# Patient Record
Sex: Male | Born: 1970 | Race: White | Hispanic: No | Marital: Single | Smoking: Current every day smoker
Health system: Southern US, Community
[De-identification: ages and names within clinical notes are randomized; demographics above are authoritative.]

## PROBLEM LIST (undated history)

## (undated) DIAGNOSIS — E039 Hypothyroidism, unspecified: Secondary | ICD-10-CM

## (undated) DIAGNOSIS — K219 Gastro-esophageal reflux disease without esophagitis: Secondary | ICD-10-CM

## (undated) DIAGNOSIS — R011 Cardiac murmur, unspecified: Secondary | ICD-10-CM

## (undated) DIAGNOSIS — J42 Unspecified chronic bronchitis: Secondary | ICD-10-CM

## (undated) DIAGNOSIS — G40409 Other generalized epilepsy and epileptic syndromes, not intractable, without status epilepticus: Secondary | ICD-10-CM

## (undated) DIAGNOSIS — F419 Anxiety disorder, unspecified: Secondary | ICD-10-CM

## (undated) DIAGNOSIS — J189 Pneumonia, unspecified organism: Secondary | ICD-10-CM

## (undated) DIAGNOSIS — I10 Essential (primary) hypertension: Secondary | ICD-10-CM

## (undated) DIAGNOSIS — M545 Low back pain, unspecified: Secondary | ICD-10-CM

## (undated) DIAGNOSIS — C4491 Basal cell carcinoma of skin, unspecified: Secondary | ICD-10-CM

## (undated) DIAGNOSIS — F329 Major depressive disorder, single episode, unspecified: Secondary | ICD-10-CM

## (undated) DIAGNOSIS — R0600 Dyspnea, unspecified: Secondary | ICD-10-CM

## (undated) DIAGNOSIS — G8929 Other chronic pain: Secondary | ICD-10-CM

## (undated) DIAGNOSIS — E7800 Pure hypercholesterolemia, unspecified: Secondary | ICD-10-CM

## (undated) DIAGNOSIS — F32A Depression, unspecified: Secondary | ICD-10-CM

## (undated) DIAGNOSIS — J45909 Unspecified asthma, uncomplicated: Secondary | ICD-10-CM

## (undated) HISTORY — PX: KNEE ARTHROSCOPY: SHX127

## (undated) HISTORY — PX: TONSILLECTOMY: SUR1361

## (undated) HISTORY — PX: CARPAL TUNNEL RELEASE: SHX101

## (undated) HISTORY — PX: BASAL CELL CARCINOMA EXCISION: SHX1214

## (undated) HISTORY — PX: BACK SURGERY: SHX140

---

## 1998-01-21 ENCOUNTER — Emergency Department (HOSPITAL_COMMUNITY): Admission: EM | Admit: 1998-01-21 | Discharge: 1998-01-21 | Payer: Self-pay | Admitting: Pediatrics

## 1999-04-24 ENCOUNTER — Emergency Department (HOSPITAL_COMMUNITY): Admission: EM | Admit: 1999-04-24 | Discharge: 1999-04-24 | Payer: Self-pay | Admitting: Emergency Medicine

## 1999-04-24 ENCOUNTER — Encounter: Payer: Self-pay | Admitting: Emergency Medicine

## 1999-04-27 ENCOUNTER — Emergency Department (HOSPITAL_COMMUNITY): Admission: EM | Admit: 1999-04-27 | Discharge: 1999-04-27 | Payer: Self-pay | Admitting: Emergency Medicine

## 1999-04-28 ENCOUNTER — Encounter: Payer: Self-pay | Admitting: Emergency Medicine

## 1999-04-30 ENCOUNTER — Emergency Department (HOSPITAL_COMMUNITY): Admission: EM | Admit: 1999-04-30 | Discharge: 1999-04-30 | Payer: Self-pay | Admitting: Emergency Medicine

## 1999-10-06 ENCOUNTER — Emergency Department (HOSPITAL_COMMUNITY): Admission: EM | Admit: 1999-10-06 | Discharge: 1999-10-06 | Payer: Self-pay | Admitting: Emergency Medicine

## 2000-03-16 ENCOUNTER — Emergency Department (HOSPITAL_COMMUNITY): Admission: EM | Admit: 2000-03-16 | Discharge: 2000-03-16 | Payer: Self-pay | Admitting: Emergency Medicine

## 2000-11-22 ENCOUNTER — Emergency Department (HOSPITAL_COMMUNITY): Admission: EM | Admit: 2000-11-22 | Discharge: 2000-11-23 | Payer: Self-pay | Admitting: Emergency Medicine

## 2005-04-20 ENCOUNTER — Emergency Department (HOSPITAL_COMMUNITY): Admission: EM | Admit: 2005-04-20 | Discharge: 2005-04-21 | Payer: Self-pay | Admitting: Emergency Medicine

## 2008-02-26 ENCOUNTER — Encounter: Admission: RE | Admit: 2008-02-26 | Discharge: 2008-02-26 | Payer: Self-pay | Admitting: Orthopedic Surgery

## 2008-04-10 ENCOUNTER — Encounter: Admission: RE | Admit: 2008-04-10 | Discharge: 2008-04-10 | Payer: Self-pay | Admitting: Specialist

## 2008-04-12 ENCOUNTER — Encounter: Admission: RE | Admit: 2008-04-12 | Discharge: 2008-04-12 | Payer: Self-pay | Admitting: Specialist

## 2009-01-28 ENCOUNTER — Emergency Department (HOSPITAL_COMMUNITY): Admission: EM | Admit: 2009-01-28 | Discharge: 2009-01-28 | Payer: Self-pay | Admitting: Emergency Medicine

## 2010-06-05 ENCOUNTER — Emergency Department: Payer: Self-pay | Admitting: Emergency Medicine

## 2010-06-17 ENCOUNTER — Emergency Department: Payer: Self-pay | Admitting: Emergency Medicine

## 2010-06-27 ENCOUNTER — Encounter
Admission: RE | Admit: 2010-06-27 | Discharge: 2010-07-22 | Payer: Self-pay | Source: Home / Self Care | Attending: Physical Medicine & Rehabilitation | Admitting: Physical Medicine & Rehabilitation

## 2010-07-01 ENCOUNTER — Ambulatory Visit: Payer: Self-pay | Admitting: Physical Medicine & Rehabilitation

## 2010-07-22 ENCOUNTER — Ambulatory Visit: Payer: Self-pay | Admitting: Physical Medicine & Rehabilitation

## 2010-10-12 ENCOUNTER — Emergency Department (HOSPITAL_COMMUNITY)
Admission: EM | Admit: 2010-10-12 | Discharge: 2010-10-12 | Payer: Self-pay | Source: Home / Self Care | Admitting: Emergency Medicine

## 2010-10-21 ENCOUNTER — Encounter
Admission: RE | Admit: 2010-10-21 | Discharge: 2010-11-11 | Payer: Self-pay | Source: Home / Self Care | Attending: Physical Medicine & Rehabilitation | Admitting: Physical Medicine & Rehabilitation

## 2010-10-24 ENCOUNTER — Ambulatory Visit
Admission: RE | Admit: 2010-10-24 | Discharge: 2010-10-24 | Payer: Self-pay | Source: Home / Self Care | Attending: Physical Medicine & Rehabilitation | Admitting: Physical Medicine & Rehabilitation

## 2010-11-25 ENCOUNTER — Ambulatory Visit: Payer: Worker's Compensation | Admitting: Physical Medicine & Rehabilitation

## 2010-11-25 ENCOUNTER — Encounter: Payer: Worker's Compensation | Attending: Physical Medicine & Rehabilitation

## 2010-11-25 DIAGNOSIS — M542 Cervicalgia: Secondary | ICD-10-CM | POA: Insufficient documentation

## 2010-11-25 DIAGNOSIS — M719 Bursopathy, unspecified: Secondary | ICD-10-CM

## 2010-11-25 DIAGNOSIS — M545 Low back pain, unspecified: Secondary | ICD-10-CM

## 2010-11-25 DIAGNOSIS — M67919 Unspecified disorder of synovium and tendon, unspecified shoulder: Secondary | ICD-10-CM | POA: Insufficient documentation

## 2010-11-25 DIAGNOSIS — M5030 Other cervical disc degeneration, unspecified cervical region: Secondary | ICD-10-CM

## 2010-11-25 DIAGNOSIS — M25529 Pain in unspecified elbow: Secondary | ICD-10-CM | POA: Insufficient documentation

## 2010-11-25 DIAGNOSIS — M47817 Spondylosis without myelopathy or radiculopathy, lumbosacral region: Secondary | ICD-10-CM | POA: Insufficient documentation

## 2010-11-25 DIAGNOSIS — M25539 Pain in unspecified wrist: Secondary | ICD-10-CM

## 2010-12-02 ENCOUNTER — Encounter (HOSPITAL_BASED_OUTPATIENT_CLINIC_OR_DEPARTMENT_OTHER)
Admission: RE | Admit: 2010-12-02 | Discharge: 2010-12-02 | Disposition: A | Discharge status: Home / Self Care | Payer: Worker's Compensation | Source: Ambulatory Visit | Attending: Orthopedic Surgery | Admitting: Orthopedic Surgery

## 2010-12-02 DIAGNOSIS — Z01812 Encounter for preprocedural laboratory examination: Secondary | ICD-10-CM | POA: Insufficient documentation

## 2010-12-02 LAB — BASIC METABOLIC PANEL WITH GFR
BUN: 9 mg/dL (ref 6–23)
CO2: 24 meq/L (ref 19–32)
Calcium: 9.3 mg/dL (ref 8.4–10.5)
Chloride: 103 meq/L (ref 96–112)
Creatinine, Ser: 0.87 mg/dL (ref 0.4–1.5)
GFR calc Af Amer: >60 mL/min (ref >60)
GFR calc non Af Amer: >60 mL/min (ref >60)
Glucose, Bld: 88 mg/dL (ref 70–99)
Potassium: 4.1 meq/L (ref 3.5–5.1)
Sodium: 135 meq/L (ref 135–145)

## 2010-12-10 ENCOUNTER — Encounter (HOSPITAL_BASED_OUTPATIENT_CLINIC_OR_DEPARTMENT_OTHER)
Admission: RE | Admit: 2010-12-10 | Discharge: 2010-12-10 | Disposition: A | Discharge status: Home / Self Care | Payer: Worker's Compensation | Source: Ambulatory Visit | Attending: Orthopedic Surgery | Admitting: Orthopedic Surgery

## 2010-12-10 DIAGNOSIS — Z01812 Encounter for preprocedural laboratory examination: Secondary | ICD-10-CM | POA: Insufficient documentation

## 2010-12-10 LAB — BASIC METABOLIC PANEL
BUN: 14 mg/dL (ref 6–23)
CO2: 26 mEq/L (ref 19–32)
Chloride: 104 mEq/L (ref 96–112)
Creatinine, Ser: 0.89 mg/dL (ref 0.4–1.5)
Potassium: 4.5 mEq/L (ref 3.5–5.1)

## 2010-12-19 ENCOUNTER — Ambulatory Visit (HOSPITAL_BASED_OUTPATIENT_CLINIC_OR_DEPARTMENT_OTHER)
Admission: RE | Admit: 2010-12-19 | Payer: Worker's Compensation | Source: Ambulatory Visit | Admitting: Orthopedic Surgery

## 2011-01-02 NOTE — Assessment & Plan Note (Signed)
This is a 40 year old male with a fall onto a wet floor onto his elbow as well as right shoulder, was seen at [facility], diagnosed with contusions.  Right shoulder, elbow, neck, and back x-rays were reported as negative for fracture.  He went to [facility], x-rays were taken of shoulder, neck, and back, although I never got these reports.  He had some complaints of knee pain, had gone to [employer] [facility] for that.  I had ordered MRI of his cervical spine as well as lumbar.  These were really unremarkable and shows some of L4-5 spondylosis.  He has been released for 10-pound lifting, sedentary 8 hours a day 5 days per week. He has complained with some neck pain and low back pain but his bigger complaints have really been his shoulder and upper extremity.  He underwent further evaluation with Dr. 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  There is some tendinosis of the rotator cuff, some degenerative changes at the [facility] joint.  He saw Dr. August Saucer who did an injection, and it sounds like Dr. August Saucer did not recommend operative treatment.  In regards to his elbow, he has seen Dr. Otelia Sergeant for that.  Dr. Otelia Sergeant did MRI of the elbow which I did not get a copy of, but according to the patient, he has recommended surgical intervention.  He has had no new problems in the interval time.  He rates his pain as a 10, but this is mainly in his shoulder, less so in the back and the neck and the knees.  He is fully functional in terms of his basic self-care. His dressing has some difficulty getting to his shoes because of his arm and a sling in the right upper extremity.  He needs some help with certain meal prep, household duties, and shopping.  He can walk 10-50 minutes at a time.  He drives.  EXAMINATION:  He has no sensory deficits in the upper or  lower extremities.  He has limited range of motion in right shoulder and elbow.  He has lot of guarding due to pain in the shoulder and elbow area.  Left upper extremity without limitations.  Lower extremities, complains of pain when he moves his knees but he has no evidence of effusion.  He ambulates without any difficulty.  IMPRESSION: 1. Cervicalgia status post fall.  Certainly, he does not have any     evidence of radiculopathy.  He had a negative EMG.  His cervical     MRI showed a mild bulge at C6-7 but to the left side where he     really does not have any symptoms in the upper extremity.  He has     shoulder pain with some acromioclavicular joint arthrosis and some     rotator cuff tendinosis.  He states he disagreed with Dr. August Saucer and     is going to Dr. Delbert Harness to get another opinion in terms of     his shoulder whether he needs surgery. 2. Right elbow pain.  Reportedly seeing Dr. Otelia Sergeant.  We discussed that I really do not think in terms of his neck and back that he needs any further treatment or workup, but he would like to get another opinion.  I did indicate that Dr. Otelia Sergeant is a spine surgeon and certainly would be more than qualified to make that evaluation, and therefore,  I will no longer see Mr. Chelf and ask Dr. Otelia Sergeant to evaluate these areas in addition to the elbow that he is already treating.  His current medications which I think are appropriate include Zanaflex 2 mg b.i.d., tramadol two p.o. t.i.d., and Lidoderm patch over his upper trap area, as well as one over his low back area, on 12, off 12.  He can be weaned off these meds after his elbow surgery.     Erick Colace, M.D. Electronically Signed    [facility]K/Med[facility] D:  11/25/2010 16:32:37  T:  11/25/2010 22:45:30  Job #:  [id number]  cc:   Kerrin Champagne, M.D. Fax: [phone]

## 2011-01-21 LAB — COMPREHENSIVE METABOLIC PANEL
AST: 28 U/L (ref 0–37)
BUN: 11 mg/dL (ref 6–23)
CO2: 24 mEq/L (ref 19–32)
Calcium: 9.1 mg/dL (ref 8.4–10.5)
Chloride: 108 mEq/L (ref 96–112)
Creatinine, Ser: 0.82 mg/dL (ref 0.4–1.5)
GFR calc Af Amer: >60 mL/min (ref >60)
GFR calc non Af Amer: >60 mL/min (ref >60)
Total Bilirubin: 0.2 mg/dL — ABNORMAL LOW (ref 0.3–1.2)

## 2011-01-21 LAB — CBC
HCT: 47.5 % (ref 39.0–52.0)
MCHC: 34.2 g/dL (ref 30.0–36.0)
MCV: 91.5 fL (ref 78.0–100.0)
Platelets: 218 10*3/uL (ref 150–400)

## 2011-01-21 LAB — DIFFERENTIAL
Basophils Absolute: 0.1 10*3/uL (ref 0.0–0.1)
Eosinophils Relative: 2 % (ref 0–5)
Lymphocytes Relative: 40 % (ref 12–46)
Lymphs Abs: 4.1 10*3/uL — ABNORMAL HIGH (ref 0.7–4.0)
Neutro Abs: 4.8 10*3/uL (ref 1.7–7.7)

## 2011-01-21 LAB — LIPASE, BLOOD: Lipase: 28 U/L (ref 11–59)

## 2011-03-04 ENCOUNTER — Other Ambulatory Visit (HOSPITAL_COMMUNITY): Payer: Self-pay | Admitting: Orthopaedic Surgery

## 2011-03-04 ENCOUNTER — Encounter (HOSPITAL_COMMUNITY)
Admission: RE | Admit: 2011-03-04 | Discharge: 2011-03-04 | Disposition: A | Discharge status: Home / Self Care | Payer: PRIVATE HEALTH INSURANCE | Source: Ambulatory Visit | Attending: Orthopaedic Surgery | Admitting: Orthopaedic Surgery

## 2011-03-04 ENCOUNTER — Ambulatory Visit (HOSPITAL_COMMUNITY)
Admission: RE | Admit: 2011-03-04 | Discharge: 2011-03-04 | Disposition: A | Discharge status: Home / Self Care | Payer: PRIVATE HEALTH INSURANCE | Source: Ambulatory Visit | Attending: Orthopaedic Surgery | Admitting: Orthopaedic Surgery

## 2011-03-04 DIAGNOSIS — S83207A Unspecified tear of unspecified meniscus, current injury, left knee, initial encounter: Secondary | ICD-10-CM

## 2011-03-04 DIAGNOSIS — Z01812 Encounter for preprocedural laboratory examination: Secondary | ICD-10-CM | POA: Insufficient documentation

## 2011-03-04 DIAGNOSIS — I10 Essential (primary) hypertension: Secondary | ICD-10-CM | POA: Insufficient documentation

## 2011-03-04 DIAGNOSIS — Z01818 Encounter for other preprocedural examination: Secondary | ICD-10-CM | POA: Insufficient documentation

## 2011-03-04 LAB — DIFFERENTIAL
Eosinophils Absolute: 0.1 10*3/uL (ref 0.0–0.7)
Lymphocytes Relative: 38 % (ref 12–46)
Lymphs Abs: 3.9 10*3/uL (ref 0.7–4.0)
Monocytes Relative: 9 % (ref 3–12)
Neutro Abs: 5.5 10*3/uL (ref 1.7–7.7)
Neutrophils Relative %: 52 % (ref 43–77)

## 2011-03-04 LAB — COMPREHENSIVE METABOLIC PANEL
ALT: 25 U/L (ref 0–53)
AST: 23 U/L (ref 0–37)
Alkaline Phosphatase: 101 U/L (ref 39–117)
CO2: 23 mEq/L (ref 19–32)
Calcium: 9.8 mg/dL (ref 8.4–10.5)
GFR calc Af Amer: >60 mL/min (ref >60)
GFR calc non Af Amer: >60 mL/min (ref >60)
Glucose, Bld: 96 mg/dL (ref 70–99)
Potassium: 4.5 mEq/L (ref 3.5–5.1)
Sodium: 137 mEq/L (ref 135–145)

## 2011-03-04 LAB — CBC
HCT: 46.3 % (ref 39.0–52.0)
Hemoglobin: 16.1 g/dL (ref 13.0–17.0)
RBC: 5.14 MIL/uL (ref 4.22–5.81)
WBC: 10.6 10*3/uL — ABNORMAL HIGH (ref 4.0–10.5)

## 2011-03-04 LAB — BASIC METABOLIC PANEL
CO2: 25 mEq/L (ref 19–32)
Calcium: 9.8 mg/dL (ref 8.4–10.5)
Chloride: 105 mEq/L (ref 96–112)
GFR calc Af Amer: >60 mL/min (ref >60)
Glucose, Bld: 99 mg/dL (ref 70–99)
Potassium: 4.5 mEq/L (ref 3.5–5.1)
Sodium: 139 mEq/L (ref 135–145)

## 2011-03-04 LAB — SURGICAL PCR SCREEN: MRSA, PCR: NEGATIVE

## 2011-03-10 ENCOUNTER — Ambulatory Visit (HOSPITAL_COMMUNITY)
Admission: RE | Admit: 2011-03-10 | Payer: Worker's Compensation | Source: Ambulatory Visit | Admitting: Orthopaedic Surgery

## 2011-04-06 ENCOUNTER — Other Ambulatory Visit (HOSPITAL_COMMUNITY): Payer: Self-pay

## 2011-05-12 ENCOUNTER — Ambulatory Visit (HOSPITAL_COMMUNITY): Admission: RE | Admit: 2011-05-12 | Payer: Self-pay | Source: Ambulatory Visit | Admitting: Orthopaedic Surgery

## 2011-06-11 ENCOUNTER — Other Ambulatory Visit (HOSPITAL_COMMUNITY): Payer: Self-pay | Admitting: Specialist

## 2011-06-12 ENCOUNTER — Ambulatory Visit (HOSPITAL_COMMUNITY)
Admission: RE | Admit: 2011-06-12 | Discharge: 2011-06-12 | Disposition: A | Discharge status: Home / Self Care | Payer: Self-pay | Source: Ambulatory Visit | Attending: Specialist | Admitting: Specialist

## 2011-06-12 DIAGNOSIS — M79609 Pain in unspecified limb: Secondary | ICD-10-CM | POA: Insufficient documentation

## 2011-06-12 DIAGNOSIS — M7989 Other specified soft tissue disorders: Secondary | ICD-10-CM | POA: Insufficient documentation

## 2011-10-16 ENCOUNTER — Encounter (HOSPITAL_COMMUNITY): Payer: Self-pay

## 2011-10-19 ENCOUNTER — Encounter (HOSPITAL_COMMUNITY): Payer: Self-pay

## 2011-10-19 ENCOUNTER — Other Ambulatory Visit (HOSPITAL_COMMUNITY): Payer: Self-pay | Admitting: Specialist

## 2011-10-19 ENCOUNTER — Encounter (HOSPITAL_COMMUNITY)
Admission: RE | Admit: 2011-10-19 | Discharge: 2011-10-19 | Disposition: A | Discharge status: Home / Self Care | Payer: BC Managed Care – PPO | Source: Ambulatory Visit | Attending: Specialist | Admitting: Specialist

## 2011-10-19 DIAGNOSIS — M5020 Other cervical disc displacement, unspecified cervical region: Secondary | ICD-10-CM | POA: Diagnosis present

## 2011-10-19 DIAGNOSIS — G5602 Carpal tunnel syndrome, left upper limb: Secondary | ICD-10-CM | POA: Diagnosis present

## 2011-10-19 HISTORY — DX: Major depressive disorder, single episode, unspecified: F32.9

## 2011-10-19 HISTORY — DX: Depression, unspecified: F32.A

## 2011-10-19 HISTORY — DX: Anxiety disorder, unspecified: F41.9

## 2011-10-19 LAB — COMPREHENSIVE METABOLIC PANEL
Albumin: 3.8 g/dL (ref 3.5–5.2)
Alkaline Phosphatase: 72 U/L (ref 39–117)
BUN: 13 mg/dL (ref 6–23)
CO2: 25 mEq/L (ref 19–32)
Chloride: 107 mEq/L (ref 96–112)
Creatinine, Ser: 0.85 mg/dL (ref 0.50–1.35)
GFR calc non Af Amer: >90 mL/min (ref >90)
Glucose, Bld: 91 mg/dL (ref 70–99)
Potassium: 4.5 mEq/L (ref 3.5–5.1)
Total Bilirubin: 0.4 mg/dL (ref 0.3–1.2)

## 2011-10-19 LAB — URINALYSIS, ROUTINE W REFLEX MICROSCOPIC
Glucose, UA: NEGATIVE mg/dL
Ketones, ur: NEGATIVE mg/dL
Leukocytes, UA: NEGATIVE
Protein, ur: NEGATIVE mg/dL
Urobilinogen, UA: 0.2 mg/dL (ref 0.0–1.0)

## 2011-10-19 LAB — CBC
HCT: 45.1 % (ref 39.0–52.0)
Hemoglobin: 15.6 g/dL (ref 13.0–17.0)
MCV: 92.2 fL (ref 78.0–100.0)
RBC: 4.89 MIL/uL (ref 4.22–5.81)
RDW: 13.3 % (ref 11.5–15.5)
WBC: 9.8 10*3/uL (ref 4.0–10.5)

## 2011-10-19 LAB — SURGICAL PCR SCREEN: MRSA, PCR: NEGATIVE

## 2011-10-19 MED ORDER — VANCOMYCIN HCL 1000 MG IV SOLR: 2000.0000 mg | INTRAVENOUS | Status: DC

## 2011-10-19 NOTE — Pre-Procedure Instructions (Signed)
20 Nathan Aguilar  10/19/2011   Your procedure is scheduled on: 10-23-2011  Report to [facility] at10:30 AM.  Call this number if you have problems the morning of surgery: [phone]   Remember:   Do not eat food:After Midnight.  May have clear liquids: up to 4 Hours before arrival.  Clear liquids include soda, tea, black coffee, apple or grape juice, broth.6:30 AM  Take these medicines the morning of surgery with A SIP OF WATER:Prisiq,valium,cymbalta,hydrocodone, indocin,seroquel   Do not wear jewelry, make-up or nail polish.  Do not wear lotions, powders, or perfumes. You may wear deodorant.  Do not shave 48 hours prior to surgery.  Do not bring valuables to the hospital.  Contacts, dentures or bridgework may not be worn into surgery.  Leave suitcase in the car. After surgery it may be brought to your room.  For patients admitted to the hospital, checkout time is 11:00 AM the day of discharge.      Special Instructions: CHG Shower Use Special Wash: 1/2 bottle night before surgery and 1/2 bottle morning of surgery.   Please read over the following fact sheets that you were given: MRSA Information and Surgical Site Infection Prevention

## 2011-10-19 NOTE — H&P (Signed)
Nathan Aguilar is an 41 y.o. male.   Chief Complaint:  Neck pain and left arm and hand pain  HPI:  Patient has several months history of progressive neck and left upper extremity pain.  Numbness and tingling of the left hand in the median nerve distribution.  Symptoms seem to worsen after a fall he experienced in August 2012 in which he sustained a contusion to his knee.  No relief with conservative treatments including, but not limited to, activity modification and pain management.  His neck pain has been present for a couple of years with MRI findings of left C6-7 disc protrusion noted in 2009.  As his symptoms have worsened a repeat MRI was done in October of 2012 showing left and paracentral disc protrusion resulting in stenosis of the left foramen affecting the left C7 nerve root.  EMG/NCV studies show median nerve entrapment at the left wrist.  His carpal tunnel symptoms have worsened also and do not respond to bracing and analgesics.  After discussion of risks and benefits of surgical intervention, pt wishes to proceed with anterior cervical discectomy and fusion C6-7 with allograft, plate and screws and left carpal tunnel release by Dr.Nitka.  Past Medical History  Diagnosis Date  . Anxiety   . Depression   . Seizures     as child    History of hypertension, but currently not treated with medications  Past Surgical History  Procedure Date  . Patella reconstruction     arthroscopic bilateral knee cap  . Carpal tunnel release     right    Bilateral toe surgery  No family history on file. Family history is positive for leukemia and diabetes per patient Social History:  reports that he has been smoking Cigarettes.  He has a 7.5 pack-year smoking history. He does not have any smokeless tobacco history on file. He reports that he does not drink alcohol or use illicit drugs.  Currently unemployed  Allergies:  Allergies  Allergen Reactions  . Penicillins Hives    Hot flashes, and sick  on stomach .    No current facility-administered medications on file as of .   No current outpatient prescriptions on file as of .  Prilosec 40mg  daily Cymbalta 60mg  daily Hydrocodone 5/500mg  prn pain Diazepam 5mg  prn  No results found for this or any previous visit (from the past 48 hour(s)). No results found.  Review of Systems  Constitutional: Negative.   HENT: Positive for neck pain.   Eyes: Negative.   Respiratory: Negative.   Cardiovascular: Negative.   Gastrointestinal: Negative.   Genitourinary: Negative.   Skin: Negative.   Neurological: Positive for focal weakness and headaches.       Left hand numbness,tingling and weakness  Endo/Heme/Allergies: Negative.   Psychiatric/Behavioral: Negative.     There were no vitals taken for this visit. Physical Exam  Constitutional: He is oriented to person, place, and time. He appears well-developed and well-nourished.  HENT:  Head: Normocephalic and atraumatic.  Eyes: EOM are normal. Pupils are equal, round, and reactive to light.  Neck:       Painful ROM of neck, especially to the left.  Positive Spurling left   Cardiovascular: Normal rate, regular rhythm and intact distal pulses.   No murmur heard. Respiratory: Breath sounds normal.  GI: Soft. Bowel sounds are normal. There is tenderness.  Musculoskeletal:       Left hand with positive phalens and positive tinels.  DTR's +1 at biceps,triceps and braci radialis.  Giving away with muscle testing of the upper extremities  Without focal weakness.  Strength and sensation intact in the lower extremities  Neurological: He is alert and oriented to person, place, and time.  Skin: Skin is warm and dry.  Psychiatric: He has a normal mood and affect.     Assessment/Plan 1.  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  Left carpal tunnel syndrome  PLAN:  ACDF C6-7 with allograft, plate and screws, and left carpal tunnel release  Balthazar Dooly M 10/19/2011, 10:04  AM

## 2011-10-20 ENCOUNTER — Other Ambulatory Visit (HOSPITAL_COMMUNITY): Payer: Self-pay

## 2011-10-21 NOTE — H&P (Signed)
[name] reviewed and signed. Nathan Aguilar

## 2011-10-22 ENCOUNTER — Other Ambulatory Visit (HOSPITAL_COMMUNITY): Payer: Self-pay

## 2011-10-23 ENCOUNTER — Encounter (HOSPITAL_COMMUNITY)
Admission: RE | Disposition: A | Discharge status: Home / Self Care | Payer: Self-pay | Source: Ambulatory Visit | Attending: Specialist

## 2011-10-23 ENCOUNTER — Encounter (HOSPITAL_COMMUNITY): Payer: Self-pay | Admitting: Surgery

## 2011-10-23 ENCOUNTER — Encounter (HOSPITAL_COMMUNITY): Payer: Self-pay | Admitting: Anesthesiology

## 2011-10-23 ENCOUNTER — Ambulatory Visit (HOSPITAL_COMMUNITY): Payer: BC Managed Care – PPO

## 2011-10-23 ENCOUNTER — Ambulatory Visit (HOSPITAL_COMMUNITY): Payer: BC Managed Care – PPO | Admitting: Anesthesiology

## 2011-10-23 ENCOUNTER — Ambulatory Visit (HOSPITAL_COMMUNITY)
Admission: RE | Admit: 2011-10-23 | Discharge: 2011-10-24 | DRG: 865 | Disposition: A | Discharge status: Home / Self Care | Payer: BC Managed Care – PPO | Source: Ambulatory Visit | Attending: Specialist | Admitting: Specialist

## 2011-10-23 DIAGNOSIS — F329 Major depressive disorder, single episode, unspecified: Secondary | ICD-10-CM | POA: Insufficient documentation

## 2011-10-23 DIAGNOSIS — Z01812 Encounter for preprocedural laboratory examination: Secondary | ICD-10-CM | POA: Insufficient documentation

## 2011-10-23 DIAGNOSIS — G5600 Carpal tunnel syndrome, unspecified upper limb: Secondary | ICD-10-CM | POA: Insufficient documentation

## 2011-10-23 DIAGNOSIS — F411 Generalized anxiety disorder: Secondary | ICD-10-CM | POA: Insufficient documentation

## 2011-10-23 DIAGNOSIS — F17200 Nicotine dependence, unspecified, uncomplicated: Secondary | ICD-10-CM | POA: Insufficient documentation

## 2011-10-23 DIAGNOSIS — F3289 Other specified depressive episodes: Secondary | ICD-10-CM | POA: Insufficient documentation

## 2011-10-23 DIAGNOSIS — G5602 Carpal tunnel syndrome, left upper limb: Secondary | ICD-10-CM | POA: Diagnosis present

## 2011-10-23 DIAGNOSIS — M5020 Other cervical disc displacement, unspecified cervical region: Secondary | ICD-10-CM | POA: Diagnosis present

## 2011-10-23 DIAGNOSIS — I10 Essential (primary) hypertension: Secondary | ICD-10-CM | POA: Insufficient documentation

## 2011-10-23 HISTORY — PX: CARPAL TUNNEL RELEASE: SHX101

## 2011-10-23 HISTORY — PX: ANTERIOR CERVICAL DECOMP/DISCECTOMY FUSION: SHX1161

## 2011-10-23 SURGERY — ANTERIOR CERVICAL DECOMPRESSION/DISCECTOMY FUSION 1 LEVEL
Anesthesia: General | Site: Spine Cervical | Wound class: Clean

## 2011-10-23 MED ORDER — LACTATED RINGERS IV SOLN
INTRAVENOUS | Status: DC | PRN
  Administered 2011-10-23 (×2): via INTRAVENOUS

## 2011-10-23 MED ORDER — SODIUM CHLORIDE 0.9 % IJ SOLN
3.0000 mL | Freq: Two times a day (BID) | INTRAMUSCULAR | Status: DC
  Administered 2011-10-23: 3 mL via INTRAVENOUS

## 2011-10-23 MED ORDER — HYDROMORPHONE HCL PF 1 MG/ML IJ SOLN: 0.5000 mg | INTRAMUSCULAR | Status: DC | PRN

## 2011-10-23 MED ORDER — NEOSTIGMINE METHYLSULFATE 1 MG/ML IJ SOLN
INTRAMUSCULAR | Status: DC | PRN
  Administered 2011-10-23: 3 mg via INTRAVENOUS

## 2011-10-23 MED ORDER — KCL IN DEXTROSE-NACL 20-5-0.45 MEQ/L-%-% IV SOLN
INTRAVENOUS | Status: DC
  Administered 2011-10-23: 23:00:00 via INTRAVENOUS
  Filled 2011-10-23 (×2): qty 1000

## 2011-10-23 MED ORDER — ACETAMINOPHEN 650 MG RE SUPP: 650.0000 mg | RECTAL | Status: DC | PRN

## 2011-10-23 MED ORDER — HYDROCODONE-ACETAMINOPHEN 5-325 MG PO TABS
1.0000 | ORAL_TABLET | ORAL | Status: DC | PRN
  Administered 2011-10-24: 2 via ORAL
  Filled 2011-10-23: qty 2

## 2011-10-23 MED ORDER — FLEET ENEMA 7-19 GM/118ML RE ENEM: 1.0000 | ENEMA | Freq: Once | RECTAL | Status: AC | PRN

## 2011-10-23 MED ORDER — ALUM & MAG HYDROXIDE-SIMETH 200-200-20 MG/5ML PO SUSP: 30.0000 mL | Freq: Four times a day (QID) | ORAL | Status: DC | PRN

## 2011-10-23 MED ORDER — BUPIVACAINE-EPINEPHRINE 0.5% -1:200000 IJ SOLN
INTRAMUSCULAR | Status: DC | PRN
  Administered 2011-10-23: 4 mL

## 2011-10-23 MED ORDER — LACTATED RINGERS IV SOLN
INTRAVENOUS | Status: DC
  Administered 2011-10-23: 13:00:00 via INTRAVENOUS

## 2011-10-23 MED ORDER — THROMBIN 20000 UNITS EX KIT
PACK | CUTANEOUS | Status: DC | PRN
  Administered 2011-10-23: 16:00:00 via TOPICAL

## 2011-10-23 MED ORDER — VENLAFAXINE HCL ER 75 MG PO CP24
75.0000 mg | ORAL_CAPSULE | Freq: Every day | ORAL | Status: DC
  Administered 2011-10-24: 75 mg via ORAL
  Filled 2011-10-23 (×2): qty 1

## 2011-10-23 MED ORDER — PROPOFOL 10 MG/ML IV EMUL
INTRAVENOUS | Status: DC | PRN
  Administered 2011-10-23: 200 mg via INTRAVENOUS

## 2011-10-23 MED ORDER — QUETIAPINE FUMARATE 25 MG PO TABS
25.0000 mg | ORAL_TABLET | Freq: Every day | ORAL | Status: DC
  Administered 2011-10-24: 25 mg via ORAL
  Filled 2011-10-23 (×2): qty 1

## 2011-10-23 MED ORDER — ONDANSETRON HCL 4 MG/2ML IJ SOLN: 4.0000 mg | INTRAMUSCULAR | Status: DC | PRN

## 2011-10-23 MED ORDER — PROMETHAZINE HCL 25 MG/ML IJ SOLN: 6.2500 mg | INTRAMUSCULAR | Status: DC | PRN

## 2011-10-23 MED ORDER — VANCOMYCIN HCL 1000 MG IV SOLR
1500.0000 mg | Freq: Once | INTRAVENOUS | Status: AC
  Administered 2011-10-24: 1500 mg via INTRAVENOUS
  Filled 2011-10-23: qty 1500

## 2011-10-23 MED ORDER — MIDAZOLAM HCL 5 MG/5ML IJ SOLN
INTRAMUSCULAR | Status: DC | PRN
  Administered 2011-10-23 (×2): 2 mg via INTRAVENOUS

## 2011-10-23 MED ORDER — SODIUM CHLORIDE 0.9 % IJ SOLN: 3.0000 mL | INTRAMUSCULAR | Status: DC | PRN

## 2011-10-23 MED ORDER — MENTHOL 3 MG MT LOZG: 1.0000 | LOZENGE | OROMUCOSAL | Status: DC | PRN

## 2011-10-23 MED ORDER — FENTANYL CITRATE 0.05 MG/ML IJ SOLN
INTRAMUSCULAR | Status: DC | PRN
  Administered 2011-10-23: 100 ug via INTRAVENOUS
  Administered 2011-10-23: 150 ug via INTRAVENOUS
  Administered 2011-10-23: 250 ug via INTRAVENOUS

## 2011-10-23 MED ORDER — ROCURONIUM BROMIDE 100 MG/10ML IV SOLN
INTRAVENOUS | Status: DC | PRN
  Administered 2011-10-23: 10 mg via INTRAVENOUS
  Administered 2011-10-23: 50 mg via INTRAVENOUS
  Administered 2011-10-23: 40 mg via INTRAVENOUS

## 2011-10-23 MED ORDER — OXYCODONE-ACETAMINOPHEN 7.5-325 MG PO TABS: 1.0000 | ORAL_TABLET | ORAL | Status: AC | PRN

## 2011-10-23 MED ORDER — HYDROMORPHONE HCL PF 1 MG/ML IJ SOLN
0.2500 mg | INTRAMUSCULAR | Status: DC | PRN
  Administered 2011-10-23 (×2): 0.5 mg via INTRAVENOUS

## 2011-10-23 MED ORDER — PANTOPRAZOLE SODIUM 40 MG IV SOLR
40.0000 mg | Freq: Every day | INTRAVENOUS | Status: DC
  Administered 2011-10-23: 40 mg via INTRAVENOUS
  Filled 2011-10-23 (×2): qty 40

## 2011-10-23 MED ORDER — PHENOL 1.4 % MT LIQD: 1.0000 | OROMUCOSAL | Status: DC | PRN

## 2011-10-23 MED ORDER — ACETAMINOPHEN 325 MG PO TABS: 650.0000 mg | ORAL_TABLET | ORAL | Status: DC | PRN

## 2011-10-23 MED ORDER — GLYCOPYRROLATE 0.2 MG/ML IJ SOLN
INTRAMUSCULAR | Status: DC | PRN
  Administered 2011-10-23: .4 mg via INTRAVENOUS

## 2011-10-23 MED ORDER — DULOXETINE HCL 60 MG PO CPEP
60.0000 mg | ORAL_CAPSULE | Freq: Every day | ORAL | Status: DC
  Administered 2011-10-24: 60 mg via ORAL
  Filled 2011-10-23: qty 1

## 2011-10-23 MED ORDER — BUPIVACAINE HCL 0.25 % IJ SOLN
INTRAMUSCULAR | Status: DC | PRN
Start: 2011-10-23 — End: 2011-10-23
  Administered 2011-10-23: 4 mL

## 2011-10-23 MED ORDER — VANCOMYCIN HCL IN DEXTROSE 1-5 GM/200ML-% IV SOLN
INTRAVENOUS | Status: AC
  Filled 2011-10-23: qty 200

## 2011-10-23 MED ORDER — SODIUM CHLORIDE 0.9 % IV SOLN
1000.0000 mg | INTRAVENOUS | Status: DC | PRN
  Administered 2011-10-23: 1000 mg via INTRAVENOUS

## 2011-10-23 MED ORDER — BISACODYL 5 MG PO TBEC: 5.0000 mg | DELAYED_RELEASE_TABLET | Freq: Every day | ORAL | Status: DC | PRN

## 2011-10-23 MED ORDER — SENNA 8.6 MG PO TABS
1.0000 | ORAL_TABLET | Freq: Two times a day (BID) | ORAL | Status: DC
  Administered 2011-10-23 – 2011-10-24 (×2): 8.6 mg via ORAL
  Filled 2011-10-23 (×3): qty 1

## 2011-10-23 MED ORDER — 0.9 % SODIUM CHLORIDE (POUR BTL) OPTIME
TOPICAL | Status: DC | PRN
  Administered 2011-10-23: 1000 mL

## 2011-10-23 MED ORDER — OXYCODONE-ACETAMINOPHEN 5-325 MG PO TABS: 1.0000 | ORAL_TABLET | ORAL | Status: DC | PRN

## 2011-10-23 MED ORDER — SODIUM CHLORIDE 0.9 % IV SOLN: 250.0000 mL | INTRAVENOUS | Status: DC

## 2011-10-23 MED ORDER — ZOLPIDEM TARTRATE 5 MG PO TABS: 5.0000 mg | ORAL_TABLET | Freq: Every evening | ORAL | Status: DC | PRN

## 2011-10-23 MED ORDER — ONDANSETRON HCL 4 MG/2ML IJ SOLN
INTRAMUSCULAR | Status: DC | PRN
  Administered 2011-10-23: 4 mg via INTRAVENOUS

## 2011-10-23 MED ORDER — DIAZEPAM 5 MG PO TABS: 5.0000 mg | ORAL_TABLET | Freq: Four times a day (QID) | ORAL | Status: DC | PRN

## 2011-10-23 MED ORDER — DOCUSATE SODIUM 100 MG PO CAPS
100.0000 mg | ORAL_CAPSULE | Freq: Two times a day (BID) | ORAL | Status: DC
  Administered 2011-10-23 – 2011-10-24 (×2): 100 mg via ORAL
  Filled 2011-10-23 (×2): qty 1

## 2011-10-23 MED ORDER — POLYETHYLENE GLYCOL 3350 17 G PO PACK
17.0000 g | PACK | Freq: Every day | ORAL | Status: DC | PRN
  Filled 2011-10-23: qty 1

## 2011-10-23 SURGICAL SUPPLY — 91 items
1 level acdf plate ×3 IMPLANT
4.5 variable hexalobe screw 14mm ×3 IMPLANT
4mm variable angle hexalobe screw  14 mm ×12 IMPLANT
BANDAGE ACE 4 STERILE (GAUZE/BANDAGES/DRESSINGS) ×3 IMPLANT
BANDAGE ELASTIC 3 VELCRO ST LF (GAUZE/BANDAGES/DRESSINGS) ×3 IMPLANT
BANDAGE ELASTIC 6 VELCRO ST LF (GAUZE/BANDAGES/DRESSINGS) IMPLANT
BANDAGE GAUZE ELAST BULKY 4 IN (GAUZE/BANDAGES/DRESSINGS) ×3 IMPLANT
BIT DRILL 14MM (INSTRUMENTS) ×2 IMPLANT
BLADE SURG ROTATE 9660 (MISCELLANEOUS) IMPLANT
BNDG ESMARK 4X9 LF (GAUZE/BANDAGES/DRESSINGS) ×3 IMPLANT
BUR ROUND FLUTED 4 SOFT TCH (BURR) IMPLANT
BUR SABER RD CUTTING 3.0 (BURR) IMPLANT
CAGE CERVICAL 8MM (Cage) ×3 IMPLANT
CLOTH BEACON ORANGE TIMEOUT ST (SAFETY) ×3 IMPLANT
CORDS BIPOLAR (ELECTRODE) ×3 IMPLANT
COVER SURGICAL LIGHT HANDLE (MISCELLANEOUS) ×3 IMPLANT
CUFF TOURNIQUET SINGLE 18IN (TOURNIQUET CUFF) ×3 IMPLANT
CUFF TOURNIQUET SINGLE 24IN (TOURNIQUET CUFF) IMPLANT
DERMABOND ADVANCED (GAUZE/BANDAGES/DRESSINGS) ×1
DERMABOND ADVANCED .7 DNX12 (GAUZE/BANDAGES/DRESSINGS) ×2 IMPLANT
DRAIN TLS ROUND 10FR (DRAIN) IMPLANT
DRAPE C-ARM 42X72 X-RAY (DRAPES) IMPLANT
DRAPE LAPAROTOMY 100X72X124 (DRAPES) IMPLANT
DRAPE MICROSCOPE LEICA (MISCELLANEOUS) ×3 IMPLANT
DRAPE POUCH INSTRU U-SHP 10X18 (DRAPES) ×3 IMPLANT
DRAPE SURG 17X23 STRL (DRAPES) ×18 IMPLANT
DRAPE U-SHAPE 47X51 STRL (DRAPES) ×3 IMPLANT
DRILL 14MM (INSTRUMENTS) ×3
DRSG MEPILEX BORDER 4X4 (GAUZE/BANDAGES/DRESSINGS) ×3 IMPLANT
DRSG MEPILEX BORDER 4X8 (GAUZE/BANDAGES/DRESSINGS) IMPLANT
DURAPREP 26ML APPLICATOR (WOUND CARE) ×3 IMPLANT
DURAPREP 6ML APPLICATOR 50/CS (WOUND CARE) ×3 IMPLANT
ELECT BLADE 4.0 EZ CLEAN MEGAD (MISCELLANEOUS) ×3
ELECT COATED BLADE 2.86 ST (ELECTRODE) ×3 IMPLANT
ELECT REM PT RETURN 9FT ADLT (ELECTROSURGICAL) ×3
ELECTRODE BLDE 4.0 EZ CLN MEGD (MISCELLANEOUS) ×2 IMPLANT
ELECTRODE REM PT RTRN 9FT ADLT (ELECTROSURGICAL) ×2 IMPLANT
GAUZE SPONGE 4X4 12PLY STRL LF (GAUZE/BANDAGES/DRESSINGS) ×3 IMPLANT
GAUZE XEROFORM 1X8 LF (GAUZE/BANDAGES/DRESSINGS) ×3 IMPLANT
GLOVE BIOGEL PI IND STRL 7.5 (GLOVE) ×2 IMPLANT
GLOVE BIOGEL PI INDICATOR 7.5 (GLOVE) ×1
GLOVE ECLIPSE 7.0 STRL STRAW (GLOVE) ×3 IMPLANT
GLOVE ECLIPSE 8.5 STRL (GLOVE) ×3 IMPLANT
GLOVE SURG 8.5 LATEX PF (GLOVE) ×3 IMPLANT
GOWN PREVENTION PLUS LG XLONG (DISPOSABLE) IMPLANT
GOWN PREVENTION PLUS XXLARGE (GOWN DISPOSABLE) ×3 IMPLANT
GOWN STRL NON-REIN LRG LVL3 (GOWN DISPOSABLE) ×6 IMPLANT
HEAD HALTER (SOFTGOODS) ×3 IMPLANT
KIT BASIN OR (CUSTOM PROCEDURE TRAY) ×3 IMPLANT
KIT ROOM TURNOVER OR (KITS) ×3 IMPLANT
MANIFOLD NEPTUNE II (INSTRUMENTS) ×3 IMPLANT
NEEDLE HYPO 25GX1X1/2 BEV (NEEDLE) ×3 IMPLANT
NEEDLE SPNL 20GX3.5 QUINCKE YW (NEEDLE) ×6 IMPLANT
NS IRRIG 1000ML POUR BTL (IV SOLUTION) ×3 IMPLANT
PACK ORTHO CERVICAL (CUSTOM PROCEDURE TRAY) ×3 IMPLANT
PACK ORTHO EXTREMITY (CUSTOM PROCEDURE TRAY) ×3 IMPLANT
PAD ARMBOARD 7.5X6 YLW CONV (MISCELLANEOUS) ×6 IMPLANT
PAD CAST 4YDX4 CTTN HI CHSV (CAST SUPPLIES) ×2 IMPLANT
PADDING CAST COTTON 4X4 STRL (CAST SUPPLIES) ×1
PADDING WEBRIL 4 STERILE (GAUZE/BANDAGES/DRESSINGS) ×3 IMPLANT
PATTIES SURGICAL .5 X.5 (GAUZE/BANDAGES/DRESSINGS) IMPLANT
PIN DISTRACTION 16MM (PIN) IMPLANT
SPONGE GAUZE 4X4 12PLY (GAUZE/BANDAGES/DRESSINGS) ×3 IMPLANT
SPONGE INTESTINAL PEANUT (DISPOSABLE) IMPLANT
SPONGE LAP 4X18 X RAY DECT (DISPOSABLE) IMPLANT
SPONGE SURGIFOAM ABS GEL 100 (HEMOSTASIS) IMPLANT
STRIP CLOSURE SKIN 1/2X4 (GAUZE/BANDAGES/DRESSINGS) ×3 IMPLANT
SUCTION FRAZIER TIP 10 FR DISP (SUCTIONS) ×3 IMPLANT
SUT ETHILON 4 0 PS 2 18 (SUTURE) ×3 IMPLANT
SUT PDS AB 4-0 P3 18 (SUTURE) IMPLANT
SUT PROLENE 3 0 PS 2 (SUTURE) ×3 IMPLANT
SUT VIC AB 0 CT1 27 (SUTURE) ×1
SUT VIC AB 0 CT1 27XBRD ANBCTR (SUTURE) ×2 IMPLANT
SUT VIC AB 1 CT1 27 (SUTURE)
SUT VIC AB 1 CT1 27XBRD ANBCTR (SUTURE) IMPLANT
SUT VIC AB 2-0 CT1 27 (SUTURE) ×1
SUT VIC AB 2-0 CT1 TAPERPNT 27 (SUTURE) ×2 IMPLANT
SUT VIC AB 3-0 PS2 18 (SUTURE) ×1
SUT VIC AB 3-0 PS2 18XBRD (SUTURE) ×2 IMPLANT
SUT VIC AB 3-0 X1 27 (SUTURE) ×3 IMPLANT
SUT VICRYL 0 UR6 27IN ABS (SUTURE) ×3 IMPLANT
SUT VICRYL 4-0 PS2 18IN ABS (SUTURE) ×3 IMPLANT
SYR 30ML LL (SYRINGE) ×3 IMPLANT
SYR CONTROL 10ML LL (SYRINGE) ×3 IMPLANT
SYSTEM CHEST DRAIN TLS 7FR (DRAIN) ×3 IMPLANT
TOWEL OR 17X24 6PK STRL BLUE (TOWEL DISPOSABLE) ×3 IMPLANT
TOWEL OR 17X26 10 PK STRL BLUE (TOWEL DISPOSABLE) ×3 IMPLANT
TRAY FOLEY CATH 14FR (SET/KITS/TRAYS/PACK) IMPLANT
TUBE CONNECTING 12X1/4 (SUCTIONS) ×3 IMPLANT
UNDERPAD 30X30 INCONTINENT (UNDERPADS AND DIAPERS) ×3 IMPLANT
WATER STERILE IRR 1000ML POUR (IV SOLUTION) ×3 IMPLANT

## 2011-10-23 NOTE — Progress Notes (Signed)
ANTIBIOTIC CONSULT NOTE - INITIAL  Pharmacy Consult for Vancomycin x 1 dose post op Indication: Surgical Prophylaxis  Allergies  Allergen Reactions  . Penicillins Hives    Hot flashes, and sick on stomach .    Patient Measurements: Height: 5\' 11"  (180.3 cm) Weight: 225 lb (102.059 kg) IBW/kg (Calculated) : 75.3    Vital Signs: Temp: 98 F (36.7 C) (01/11 1939) Temp src: Oral (01/11 1939) BP: 137/81 mmHg (01/11 1939) Pulse Rate: 76  (01/11 1939)   Labs: from 10/19/11  WBC 9.8, H/H = 15.6/45.1, PLTC = 219K SCr = 0.85  Estimated Creatinine Clearance: 137.7 ml/min (by C-G formula based on Cr of 0.85). No results found for this basename: VANCOTROUGH:2,VANCOPEAK:2,VANCORANDOM:2,GENTTROUGH:2,GENTPEAK:2,GENTRANDOM:2,TOBRATROUGH:2,TOBRAPEAK:2,TOBRARND:2,AMIKACINPEAK:2,AMIKACINTROU:2,AMIKACIN:2, in the last 72 hours   Microbiology: Recent Results (from the past 720 hour(s))  SURGICAL PCR SCREEN     Status: Normal   Collection Time   10/19/11 10:17 AM      Component Value Range Status Comment   MRSA, PCR NEGATIVE  NEGATIVE  Final    Staphylococcus aureus NEGATIVE  NEGATIVE  Final     Medical History: Past Medical History  Diagnosis Date  . Anxiety   . Depression   . Seizures     as child  h/o HTN- not currently requiring medications  Medications:  Prescriptions prior to admission  Medication Sig Dispense Refill  . desvenlafaxine (PRISTIQ) 50 MG 24 hr tablet Take 50 mg by mouth daily.        . diazepam (VALIUM) 10 MG tablet Take 10 mg by mouth daily as needed. For anxiety.       . DULoxetine (CYMBALTA) 60 MG capsule Take 60 mg by mouth daily.        . QUEtiapine (SEROQUEL) 25 MG tablet Take 25 mg by mouth daily.        Marland Kitchen DISCONTD: HYDROcodone-acetaminophen (NORCO) 5-325 MG per tablet Take 1 tablet by mouth 2 (two) times daily as needed. For pain.       Marland Kitchen DISCONTD: indomethacin (INDOCIN) 25 MG capsule Take 25 mg by mouth 2 (two) times daily.         Scheduled:    .  docusate sodium  100 mg Oral BID  . DULoxetine  60 mg Oral Daily  . pantoprazole (PROTONIX) IV  40 mg Intravenous QHS  . QUEtiapine  25 mg Oral Daily  . senna  1 tablet Oral BID  . sodium chloride  3 mL Intravenous Q12H  . venlafaxine  75 mg Oral Q breakfast   Assessment:  41 yo male s/p ACDF C6-7 and left carpal tunnel release today 10/23/11. Allergic to PCN. Received Vancomycin 1gm IV at 14:22 .  SCr 0.85, Estimated CrCl = 137 ml/min.   Goal of Therapy:   Vancomycin trough level 10-15 mcg/ml (x 1 dose only post op)  Plan:  Vancomycin 1500mg  IV x1 at 0200 tomorrow.     Arman Filter 10/23/2011,9:36 PM

## 2011-10-23 NOTE — Anesthesia Procedure Notes (Signed)
Procedure Name: Intubation Date/Time: 10/23/2011 2:00 PM Performed by: Ellin Goodie Pre-anesthesia Checklist: Patient identified, Emergency Drugs available, Suction available, Patient being monitored and Timeout performed Patient Re-evaluated:Patient Re-evaluated prior to inductionOxygen Delivery Method: Circle System Utilized Preoxygenation: Pre-oxygenation with 100% oxygen Intubation Type: IV induction Ventilation: Mask ventilation without difficulty Laryngoscope Size: Mac and 4 Grade View: Grade I Tube size: 7.5 mm Number of attempts: 1 Airway Equipment and Method: stylet Placement Confirmation: ETT inserted through vocal cords under direct vision,  positive ETCO2 and breath sounds checked- equal and bilateral Secured at: 23 cm Tube secured with: Tape Dental Injury: Teeth and Oropharynx as per pre-operative assessment

## 2011-10-23 NOTE — H&P (Signed)
The patient has been re-examined, and the chart reviewed, and there have been no interval changes to the documented history and physical.    The risks, benefits, and alternatives have been discussed at length, and the patient is willing to proceed.   

## 2011-10-23 NOTE — Op Note (Signed)
10/23/2011  1:32 PM  PATIENT:  Nathan Aguilar  41 y.o. male  MRN: [id number] OPERATIVE REPORT   PRE-OPERATIVE DIAGNOSIS:  left and central HNP C6-7, left carpal tunnel syndrome  POST-OPERATIVE DIAGNOSIS: left and central HNP C6-7, left carpal tunnel syndrome.  PROCEDURE:  Procedure(s): ANTERIOR CERVICAL DECOMPRESSION/DISCECTOMY FUSION C6-7, ALPHATEC PLATE AND SCREWS LEFT OPEN CARPAL TUNNEL RELEASE. MICROSCOPE, TRANSGRAFT AND LOCAL BONE GRAFT.    SURGEON:  Kerrin Champagne, MD     ASSISTANT:  Maud Deed, PA-C  (Present throughout the entire procedure     and necessary for completion of procedure in a timely manner)     ANESTHESIA:  General,Supplemented with local anesthesia. Dr.m Charleneff    COMPLICATIONS:  None.   COMPONENTS: ALPHATEC CERVICAL PLATE AND SCREWS  PROCEDURE: The patient was met in the holding area, and the appropriate left volar wrist and left C6-7 cervical level identified and marked with an "x" and my initials. The patient was then transported to OR and was placed on the operative table in a supine position head supported on the well padded Mayfield horseshoe. The patient was then placed under  general anesthesia without difficulty intubated atraumaticly.     Nursing staff inserted a Foley catheter under sterile conditions. Cervical spine was positioned with a Mayfield horseshoe and 5 pound cervical halter traction. All pressure points well padded and semi-beach chair position.PAS to prevent DVT Both legs.  Arm holder both arms. Standard prep with DuraPrep solution the anterior cervical spine chest. Draped in the usual manner. Iodine vi drape was used. Standard timeout protocol was carried out identifying the patient procedure side of the procedure and level. The skin the left neck was infiltrated with Marcaine with epinephrine total of 7 cc. This at the level of expected C6-7incision and also along a skin crease in line with the patients lines of Langer. 7.5 cm  incision transverse at the C6-7 level and carried down to the level of the platysma. Then was carried down to the anterior aspect of the sternocleidomastoid muscle. The interval between the trachea and esophagus medially and the carotid sheath laterally was developed with Metzenbaum scissors and blunt dissection exposing the anterior aspect of cervical spine at the C6-7 level.  The prevertebral fascia anterior to the cervical was cauterized with bipolar and teased across the midline with a Barista. An 18-gauge spinal needle was placed with sheath intact allowing only a centimeter to extend into the C6-7 disc and observed on lateral radiogragh  at the C6-7 level. Handheld Cloward retraction of the soft tissues while identifying the level at C6-7 and also while removing a portion of the anterior aspect of the disc with15 blade scalpel and pituitary. Medial border of the longus collie muscles was carefully elevated bilaterally and self-retaining retractors were introduced the foot of the blade beneath the medial border of the longus colli muscles.14 mm screw posts placed at the C6 and C7 levels. Soft tissue overlying the anterior borders of the disc space at C6-7 level carefully debridement of soft tissue back to bony edges. The anterior lip osteophytes were then resected using rongeur. This bone was preserved for later bone grafting purposes. The disc space was then first prepared using loupe magnification and headlight with resection of degenerative disc annulus anteriorly and posteriorly and cartilaginous endplates using micro-curettes pituitaries and a high-speed bur. The operating room microscope was draped sterilely and brought into the field. Under the operating room microscope and posterior aspect of the disc was excised using  micro-curettes pituitary rongeur . Posterior lip osteophytes carefully resected with 1 and 2 mm Kerrison and foraminotomy performed over both C7 nerve roots using 1 and 2 mm  Kerrisons disc herniation material was noted centrally and into the left foramen some of which represented reflected portion of the patient's annulus into the neuroforamen. Following decompression of the spinal cord and both C7 nerve roots, irrigation was carried out. The endplates of the inferior aspect of C6 and superior aspect of C7 were carefully prepared using a high-speed bur to parallel. The disc space was then sounded utilized and a precontoured sounder for the transgraft implant. Surrounding was carried up to a 8mm implant.  8 mm transgraft spacer was felt to provide best fit for the C6-7 disc space. The 8mm transgraft permanent  allograft implant was then brought onto the field. It was then packed with local bone graft that had been harvested previously. The implant was then carefully placed over the intervertebral disc space at C6-7 level. Care taken to ensure that no bone or soft tissue debris within the disc space that could be retropulsed with insertion of the cage and bone graft. The implant was then impacted into place with the head placed in longitudinal cervical traction.  The implant was felt to be in excellent position alignment. Cervical distraction instrumentation  And screw posts were removed and the screw post holes coated with wax for hemostasis. Length of the plate was then chosen using bone wax applied to a cottonoid string and measuring from the edge of the lower cervical vertebral border of C6 the upper cervical border of C7. A  14 millimeter plate was chosen. 14 mm screws were then placed into the C6 locking the plate in place. Additional 14 mm screws were then placed in the C7 level, cervical traction had been released prior to screw placement. Intraoperative lateral radiograph demonstrated the plates and screws in good position alignment no sign of impingement upon the cervical canal. The graft appeared in good position alignment.  Upper extremity longitudinal traction using wrist  restraints was necessary to obtain visualization of the lower cervical level.  This point irrigation was carried out at cervical incision site.The esophagus examined at the cervical level  and found to be normal. Irrigation was again carried out there was no active bleeding present. The incisions were then closed by approximating the deep subcutaneous layers the platysma layer with interrupted 3-0 Vicryl suture and the superficial fascia overlying the sternocleidomastoid muscle with interrupted 3-0 Vicryl sutures. The subcutaneous layers were approximated with interrupted 3-0 Vicryl sutures as were the superficial layers. The skin was closed with a running subcutaneous stitch of 4-0 Vicryl at the operative C6-7 transverse incision site. Skin was approximated with Dermabond. Mepilex bandage was applied. A Philadelphia collar was then applied to the cervical spine released on the cervical spine and drapes were removed. Patient's OR table was then returned to the flat position. A hand table was then applied to the right side of the table for the left-sided carpal tunnel release surgery. At the end of the cervical spine surgery case all instrument and sponge counts were correct. The right upper extremity was then prepared for surgery for carpal tunnel release.10/23/2011  The left upper extremity was then prepped using sterile conditions and draped using sterile technique.  Time-out procedure was called and correct  .Using loope magnification and head lamp a 1.5 inch incision curved at the wrist crease with 15 blade scalpel.  Incision through skin and subcutaneous tissue  to the volar forearm fascia and  transverse carpal ligament. Fascia then carefully lifted and incised with Stevens scissors inline with the fourth digit. The skin and subcutaneous tissue retracted and the volar fascia divided under direct vision from distal to proximal. A freer elevator then carefully placed between the median nerve and the  transverse carpal ligament protecting the  median nerve as the transverse carpal ligament was divided with a 15 blade scalpel in line with the fourth digit. Retracting the distal skin and subcutaneous tissues distally under direct visualization the remaining portions of the transverse carpal ligament were divided with tenotomy scissors again in line with the fourth digit. The palmar fascia was then divided until the traversing superficial palmar arch was identified and preserved intact.  The motor branch of the median nerve was carefully examined and identified intact. Tourniquet was then released. Bleeding controlled with bipolar electrocautery. The incision was then irrigated with copious amounts of irrigant solution, No active bleeding was present. The incision closed with a single layer skin closure of 4-0 nylon horizontal mattress sutures. Dry dressing of adaptic, 4x4s held in place with sterile webril.  A well padded volar splint applied with ace wrap.  The patient reactivated and returned to the PACU in good condition.  All instruments and sponge counts were correct.          Lyndsi Altic E  10/23/2011, 2:05 PM

## 2011-10-23 NOTE — Anesthesia Postprocedure Evaluation (Signed)
  Anesthesia Post-op Note  Patient: Nathan Aguilar  Procedure(s) Performed:  ANTERIOR CERVICAL DECOMPRESSION/DISCECTOMY FUSION 1 LEVEL - Anterior cervical discectomy fusion C6-7 with local bone graft, plate, screws, Allograft bone graft; CARPAL TUNNEL RELEASE - left carpal tunnel release  Patient Location: PACU  Anesthesia Type: General  Level of Consciousness: awake, alert  and oriented  Airway and Oxygen Therapy: Patient Spontanous Breathing and Patient connected to nasal cannula oxygen  Post-op Pain: mild  Post-op Assessment: Post-op Vital signs reviewed, Patient's Cardiovascular Status Stable, Respiratory Function Stable, Patent Airway, No signs of Nausea or vomiting and Pain level controlled  Post-op Vital Signs: Reviewed and stable  Complications: No apparent anesthesia complications

## 2011-10-23 NOTE — OR Nursing (Signed)
Procedure 2 : left hand prepped from fingers to mid forearm by Timoteo Expose, RN. Arm resting on padded armboard. Time out: 1650         Start time: 1654

## 2011-10-23 NOTE — Anesthesia Preprocedure Evaluation (Addendum)
Anesthesia Evaluation  Patient identified by MRN, date of birth, ID band Patient awake    Reviewed: Allergy & Precautions, H&P , NPO status , Patient's Chart, lab work & pertinent test results  Airway Mallampati: II TM Distance: >3 FB Neck ROM: Full    Dental No notable dental hx. (+) Teeth Intact and Dental Advisory Given   Pulmonary Current Smoker,  clear to auscultation  Pulmonary exam normal       Cardiovascular neg cardio ROS Regular Normal    Neuro/Psych Seizures - (no seizure since age 26),  Anxiety  Neuromuscular disease (chronic neck pain-daily pain meds)    GI/Hepatic negative GI ROS, Neg liver ROS,   Endo/Other  Negative Endocrine ROS  Renal/GU negative Renal ROS     Musculoskeletal   Abdominal (+) obese,   Peds  Hematology   Anesthesia Other Findings   Reproductive/Obstetrics                           Anesthesia Physical Anesthesia Plan  ASA: II  Anesthesia Plan: General   Post-op Pain Management:    Induction:   Airway Management Planned: Oral ETT  Additional Equipment:   Intra-op Plan:   Post-operative Plan: Extubation in OR  Informed Consent: I have reviewed the patients History and Physical, chart, labs and discussed the procedure including the risks, benefits and alternatives for the proposed anesthesia with the patient or authorized representative who has indicated his/her understanding and acceptance.   Dental advisory given  Plan Discussed with: CRNA and Surgeon  Anesthesia Plan Comments: (Plan routine monitors, GETA)        Anesthesia Quick Evaluation

## 2011-10-23 NOTE — Transfer of Care (Signed)
Immediate Anesthesia Transfer of Care Note  Patient: Nathan Aguilar  Procedure(s) Performed:  ANTERIOR CERVICAL DECOMPRESSION/DISCECTOMY FUSION 1 LEVEL - Anterior cervical discectomy fusion C6-7 with local bone graft, plate, screws, Allograft bone graft; CARPAL TUNNEL RELEASE - left carpal tunnel release  Patient Location: PACU  Anesthesia Type: General  Level of Consciousness: awake and alert   Airway & Oxygen Therapy: Patient Spontanous Breathing and Patient connected to nasal cannula oxygen  Post-op Assessment: Report given to PACU RN, Post -op Vital signs reviewed and stable and Patient moving all extremities X 4  Post vital signs: Reviewed and stable Filed Vitals:   10/23/11 1034  BP: 158/102  Pulse: 61  Temp: 36.4 C  Resp: 18    Complications: No apparent anesthesia complications

## 2011-10-23 NOTE — OR Nursing (Signed)
Procedure 1 ended at 1638.

## 2011-10-23 NOTE — Brief Op Note (Signed)
10/23/2011  1:26 PM  PATIENT:  Nathan Aguilar  41 y.o. 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 8mm transgraft allograft and local bone graft.  SURGEON:  Surgeon(s): Kerrin Champagne, MD  PHYSICIAN ASSISTANT:Sheila Dondra Spry   ANESTHESIA:   local and general, Dr. Judie Petit.  EBL:   50cc  BLOOD ADMINISTERED:none  DRAINS: (7 fr) TLS Drain(s) to suction in the left anterior cervical spine. Sewn in place.  LOCAL MEDICATIONS USED:  MARCAINE 0.5% with 1/200,000 epinepthrine left cervical 7CC. Marcaine 0.5% plain 5cc left wrist.  SPECIMEN:  No Specimen  DISPOSITION OF SPECIMEN:  N/A  COUNTS:  YES  TOURNIQUET:  10 min @250mmHg  DICTATION: .Dragon Dictation  PLAN OF CARE: Admit to inpatient   PATIENT DISPOSITION:  PACU - hemodynamically stable.   Delay start of Pharmacological VTE agent (>24hrs) due to surgical blood loss or risk of bleeding:  YES

## 2011-10-23 NOTE — Progress Notes (Signed)
Orthopedic Tech Progress Note Patient Details:  Nathan Aguilar August 27, 1971 [id number]  Other Ortho Devices Type of Ortho Device: [place] cervical collar Ortho Device Location: neck Ortho Device Interventions: Ordered Viewed order from doctor's order list  Nikki Dom 10/23/2011, 4:12 PM

## 2011-10-24 NOTE — Discharge Planning (Signed)
Pt given d/c instructions along with f/u apt to be made and prescriiption for percocet.  Pt verbalized understanding of d/c instructions.  Pt d/c'd home via w/c accompanied by medical staff and significant other.

## 2011-10-24 NOTE — Progress Notes (Signed)
Subjective: 1 Day Post-Op Procedure(s) (LRB): ANTERIOR CERVICAL DECOMPRESSION/DISCECTOMY FUSION 1 LEVEL (N/A) CARPAL TUNNEL RELEASE (Left) No complaints.    Objective: Vital signs in last 24 hours: Temp:  [97.6 F (36.4 C)-98.8 F (37.1 C)] 97.9 F (36.6 C) (01/12 0200) Pulse Rate:  [61-94] 94  (01/12 0200) Resp:  [16-23] 16  (01/12 0200) BP: (116-158)/(57-102) 116/70 mmHg (01/12 0200) SpO2:  [91 %-100 %] 92 % (01/12 0200) Weight:  [102.059 kg (225 lb)] 102.059 kg (225 lb) (01/11 2100)  Intake/Output from previous day: 01/11 0701 - 01/12 0700 In: 1900 [I.V.:1900] Out: 1160 [Urine:1050; Drains:10; Blood:100] Intake/Output this shift: Total I/O In: -  Out: 710 [Urine:700; Drains:10]  No results found for this basename: HGB:5 in the last 72 hours No results found for this basename: WBC:2,RBC:2,HCT:2,PLT:2 in the last 72 hours No results found for this basename: NA:2,K:2,CL:2,CO2:2,BUN:2,CREATININE:2,GLUCOSE:2,CALCIUM:2 in the last 72 hours No results found for this basename: LABPT:2,INR:2 in the last 72 hours  Neurologically intact  Assessment/Plan: 1 Day Post-Op Procedure(s) (LRB): ANTERIOR CERVICAL DECOMPRESSION/DISCECTOMY FUSION 1 LEVEL (N/A) CARPAL TUNNEL RELEASE (Left) D/c home. Drain removed rx on chart  DUDA,MARCUS V 10/24/2011, 6:53 AM

## 2011-10-26 ENCOUNTER — Encounter (HOSPITAL_COMMUNITY): Payer: Self-pay | Admitting: Specialist

## 2011-11-10 NOTE — Discharge Summary (Signed)
Physician Discharge Summary  Patient ID: Nathan Aguilar MRN: [id number] DOB/AGE: 1971/07/14 41 y.o.  Admit date: 10/23/2011 Discharge date: 11/10/2011  Admission Diagnoses:  Carpal tunnel syndrome, left HNP C6-7 LEFT Discharge Diagnoses:  Principal Problem:  *Carpal tunnel syndrome, left Active Problems:  HNP (herniated nucleus pulposus), cervical C6-7 left  Past Medical History  Diagnosis Date  . Anxiety   . Depression   . Seizures     as child    Surgeries: Procedure(s): ANTERIOR CERVICAL DECOMPRESSION/DISCECTOMY FUSION 1 LEVEL C6-7 CARPAL TUNNEL RELEASE on 10/23/2011 (left)   Consultants (if any):  none  Discharged Condition: Improved  Hospital Course: Nathan Aguilar is an 41 y.o. male who was admitted 10/23/2011 with a diagnosis of Carpal tunnel syndrome, left and  HNP at C6-7 left and central.  He  went to the operating room on 10/23/2011 and underwent the above named procedures.    He was given perioperative antibiotics:  Anti-infectives      Start     Dose/Rate Route Frequency Ordered Stop   10/24/11 0200   vancomycin (VANCOCIN) 1,500 mg in sodium chloride 0.9 % 500 mL IVPB        1,500 mg 250 mL/hr over 120 Minutes Intravenous  Once 10/23/11 2150 10/24/11 0538        .  He was given sequential compression devices, early ambulation. He benefited maximally from their hospital stay and there were no complications.    Recent vital signs:  Filed Vitals:   10/24/11 0600  BP: 122/74  Pulse: 85  Temp: 98.1 F (36.7 C)  Resp: 16    Recent laboratory studies:  Lab Results  Component Value Date   HGB 15.6 10/19/2011   HGB 16.1 03/04/2011   HGB 16.3 01/28/2009   Lab Results  Component Value Date   WBC 9.8 10/19/2011   PLT 219 10/19/2011   No results found for this basename: INR   Lab Results  Component Value Date   NA 141 10/19/2011   K 4.5 10/19/2011   CL 107 10/19/2011   CO2 25 10/19/2011   BUN 13 10/19/2011   CREATININE 0.85 10/19/2011   GLUCOSE 91  10/19/2011    Discharge Medications:   Medication List  As of 11/10/2011 10:55 AM   STOP taking these medications         HYDROcodone-acetaminophen 5-325 MG per tablet      indomethacin 25 MG capsule         TAKE these medications         desvenlafaxine 50 MG 24 hr tablet   Commonly known as: PRISTIQ   Take 50 mg by mouth daily.      diazepam 10 MG tablet   Commonly known as: VALIUM   Take 10 mg by mouth daily as needed. For anxiety.      DULoxetine 60 MG capsule   Commonly known as: CYMBALTA   Take 60 mg by mouth daily.      QUEtiapine 25 MG tablet   Commonly known as: SEROQUEL   Take 25 mg by mouth daily.            Diagnostic Studies: Dg Cervical Spine 2-3 Views  10/23/2011  *RADIOLOGY REPORT*  Clinical Data: ACDF.  CERVICAL SPINE - 2-3 VIEW  Comparison: Post-myelogram CT scan 04/12/2008.  Findings: We are provided with two-view intraoperative views cervical spine in the lateral projection.  On film labeled #1, metallic probe is at the C6-7 level.  On film labeled #2, anterior  plate and screws interbody spacer are in place at C6-7.  IMPRESSION: C6-7 ACDF.  Original Report Authenticated By: Bernadene Bell. Maricela Curet, M.D.    Disposition: Home or Self Care  Discharge Orders    Future Orders Please Complete By Expires   Diet - low sodium heart healthy      Diet - low sodium heart healthy      Call MD / Call 911      Comments:   If you experience chest pain or shortness of breath, CALL 911 and be transported to the hospital emergency room.  If you develope a fever above 101 F, pus (white drainage) or increased drainage or redness at the wound, or calf pain, call your surgeon's office.   Constipation Prevention      Comments:   Drink plenty of fluids.  Prune juice may be helpful.  You may use a stool softener, such as Colace (over the counter) 100 mg twice a day.  Use MiraLax (over the counter) for constipation as needed.   Increase activity slowly as tolerated      Weight  Bearing as taught in Physical Therapy      Comments:   Use a walker or crutches as instructed.   Discharge instructions      Comments:   No lifting greater than 5 lbs. No lifting with the left arm. Avoid bending, stooping and twisting and use of arms overhead. Move fingers often. Walk in house for first week them may start to get out slowly increasing distance up to one mile by 3 weeks post op. Keep incision dry left neck for 3 days, may use tegaderm or similar water impervious dressing. Keep the left forearm splint dry. you return for follow up .    Driving restrictions      Comments:   No driving for 3 weeks.   Lifting restrictions      Comments:   No lifting for 6 weeks   Call MD / Call 911      Comments:   If you experience chest pain or shortness of breath, CALL 911 and be transported to the hospital emergency room.  If you develope a fever above 101 F, pus (white drainage) or increased drainage or redness at the wound, or calf pain, call your surgeon's office.   Constipation Prevention      Comments:   Drink plenty of fluids.  Prune juice may be helpful.  You may use a stool softener, such as Colace (over the counter) 100 mg twice a day.  Use MiraLax (over the counter) for constipation as needed.   Increase activity slowly as tolerated      Weight Bearing as taught in Physical Therapy      Comments:   Use a walker or crutches as instructed.      Follow-up Information    Follow up with NITKA,JAMES E, MD in 2 weeks.   Contact information:   [employer] [address] [place] [address] [phone]           Signed: Wende Neighbors 11/10/2011, 10:55 AM

## 2011-11-10 NOTE — Discharge Summary (Signed)
Reviewed

## 2011-12-07 ENCOUNTER — Other Ambulatory Visit: Payer: Self-pay | Admitting: Specialist

## 2011-12-07 DIAGNOSIS — R531 Weakness: Secondary | ICD-10-CM

## 2011-12-07 DIAGNOSIS — R200 Anesthesia of skin: Secondary | ICD-10-CM

## 2011-12-10 ENCOUNTER — Ambulatory Visit
Admission: RE | Admit: 2011-12-10 | Discharge: 2011-12-10 | Disposition: A | Discharge status: Home / Self Care | Payer: BC Managed Care – PPO | Source: Ambulatory Visit | Attending: Specialist | Admitting: Specialist

## 2011-12-10 DIAGNOSIS — R531 Weakness: Secondary | ICD-10-CM

## 2011-12-10 DIAGNOSIS — R200 Anesthesia of skin: Secondary | ICD-10-CM

## 2012-01-09 ENCOUNTER — Inpatient Hospital Stay: Admission: RE | Admit: 2012-01-09 | Payer: Self-pay | Source: Ambulatory Visit

## 2012-01-14 ENCOUNTER — Inpatient Hospital Stay: Admission: RE | Admit: 2012-01-14 | Payer: Self-pay | Source: Ambulatory Visit

## 2012-01-17 ENCOUNTER — Ambulatory Visit
Admission: RE | Admit: 2012-01-17 | Discharge: 2012-01-17 | Disposition: A | Discharge status: Home / Self Care | Payer: BC Managed Care – PPO | Source: Ambulatory Visit | Attending: Specialist | Admitting: Specialist

## 2012-07-14 DIAGNOSIS — Z0279 Encounter for issue of other medical certificate: Secondary | ICD-10-CM

## 2012-11-18 ENCOUNTER — Other Ambulatory Visit: Payer: Self-pay | Admitting: Radiology

## 2012-11-18 NOTE — Telephone Encounter (Signed)
Error/ this is medman/ not epic

## 2015-12-17 DIAGNOSIS — E7800 Pure hypercholesterolemia, unspecified: Secondary | ICD-10-CM | POA: Insufficient documentation

## 2015-12-17 DIAGNOSIS — M8580 Other specified disorders of bone density and structure, unspecified site: Secondary | ICD-10-CM | POA: Insufficient documentation

## 2015-12-17 DIAGNOSIS — N183 Chronic kidney disease, stage 3 unspecified: Secondary | ICD-10-CM | POA: Insufficient documentation

## 2015-12-17 DIAGNOSIS — M75101 Unspecified rotator cuff tear or rupture of right shoulder, not specified as traumatic: Secondary | ICD-10-CM | POA: Insufficient documentation

## 2015-12-17 DIAGNOSIS — M1710 Unilateral primary osteoarthritis, unspecified knee: Secondary | ICD-10-CM | POA: Insufficient documentation

## 2015-12-17 DIAGNOSIS — I129 Hypertensive chronic kidney disease with stage 1 through stage 4 chronic kidney disease, or unspecified chronic kidney disease: Secondary | ICD-10-CM | POA: Insufficient documentation

## 2015-12-17 DIAGNOSIS — Z79899 Other long term (current) drug therapy: Secondary | ICD-10-CM | POA: Insufficient documentation

## 2015-12-17 DIAGNOSIS — K5909 Other constipation: Secondary | ICD-10-CM | POA: Insufficient documentation

## 2016-05-18 DIAGNOSIS — S39012A Strain of muscle, fascia and tendon of lower back, initial encounter: Secondary | ICD-10-CM | POA: Insufficient documentation

## 2016-05-18 DIAGNOSIS — M5412 Radiculopathy, cervical region: Secondary | ICD-10-CM | POA: Insufficient documentation

## 2016-05-18 DIAGNOSIS — M47816 Spondylosis without myelopathy or radiculopathy, lumbar region: Secondary | ICD-10-CM | POA: Insufficient documentation

## 2016-06-25 ENCOUNTER — Other Ambulatory Visit (HOSPITAL_COMMUNITY): Payer: Self-pay | Admitting: Specialist

## 2016-06-25 ENCOUNTER — Ambulatory Visit (HOSPITAL_COMMUNITY)
Admission: RE | Admit: 2016-06-25 | Discharge: 2016-06-25 | Disposition: A | Discharge status: Home / Self Care | Payer: BLUE CROSS/BLUE SHIELD | Source: Ambulatory Visit | Attending: Internal Medicine | Admitting: Internal Medicine

## 2016-06-25 DIAGNOSIS — M79606 Pain in leg, unspecified: Secondary | ICD-10-CM

## 2016-06-25 DIAGNOSIS — M79605 Pain in left leg: Secondary | ICD-10-CM | POA: Diagnosis not present

## 2016-06-25 DIAGNOSIS — M79604 Pain in right leg: Secondary | ICD-10-CM | POA: Diagnosis present

## 2016-06-25 DIAGNOSIS — M7989 Other specified soft tissue disorders: Secondary | ICD-10-CM | POA: Insufficient documentation

## 2016-06-25 NOTE — Progress Notes (Addendum)
**  Preliminary report by tech**  Bilateral lower extremity venous duplex completed. There is no evidence of deep or superficial vein thrombosis involving the right and left lower extremities. All visualized vessels appear patent and compressible. There is no evidence of Baker's cysts bilaterally. Results given to Marcie Bal at [phone].  06/25/16 4:49 PM Carlos Levering RVT

## 2016-07-02 ENCOUNTER — Other Ambulatory Visit: Payer: Self-pay | Admitting: Orthopedic Surgery

## 2016-07-02 DIAGNOSIS — M25511 Pain in right shoulder: Secondary | ICD-10-CM

## 2016-07-02 DIAGNOSIS — M25512 Pain in left shoulder: Secondary | ICD-10-CM

## 2016-07-04 DIAGNOSIS — M5416 Radiculopathy, lumbar region: Secondary | ICD-10-CM | POA: Insufficient documentation

## 2016-07-10 ENCOUNTER — Ambulatory Visit
Admission: RE | Admit: 2016-07-10 | Discharge: 2016-07-10 | Disposition: A | Discharge status: Home / Self Care | Payer: BLUE CROSS/BLUE SHIELD | Source: Ambulatory Visit | Attending: Orthopedic Surgery | Admitting: Orthopedic Surgery

## 2016-07-10 DIAGNOSIS — M25511 Pain in right shoulder: Secondary | ICD-10-CM

## 2016-07-10 DIAGNOSIS — M25512 Pain in left shoulder: Secondary | ICD-10-CM

## 2016-07-14 ENCOUNTER — Encounter (INDEPENDENT_AMBULATORY_CARE_PROVIDER_SITE_OTHER): Payer: Worker's Compensation | Admitting: Physical Medicine and Rehabilitation

## 2016-07-14 DIAGNOSIS — M48062 Spinal stenosis, lumbar region with neurogenic claudication: Secondary | ICD-10-CM

## 2016-07-16 ENCOUNTER — Encounter (INDEPENDENT_AMBULATORY_CARE_PROVIDER_SITE_OTHER): Payer: Self-pay

## 2016-07-16 ENCOUNTER — Ambulatory Visit (INDEPENDENT_AMBULATORY_CARE_PROVIDER_SITE_OTHER): Payer: BLUE CROSS/BLUE SHIELD | Admitting: Specialist

## 2016-07-22 ENCOUNTER — Encounter (INDEPENDENT_AMBULATORY_CARE_PROVIDER_SITE_OTHER): Payer: Worker's Compensation | Admitting: Physical Medicine and Rehabilitation

## 2016-07-22 DIAGNOSIS — R202 Paresthesia of skin: Secondary | ICD-10-CM | POA: Diagnosis not present

## 2016-07-29 ENCOUNTER — Ambulatory Visit (INDEPENDENT_AMBULATORY_CARE_PROVIDER_SITE_OTHER): Payer: BLUE CROSS/BLUE SHIELD | Admitting: Orthopedic Surgery

## 2016-07-29 ENCOUNTER — Ambulatory Visit: Payer: BLUE CROSS/BLUE SHIELD | Admitting: Sports Medicine

## 2016-07-29 DIAGNOSIS — M25562 Pain in left knee: Secondary | ICD-10-CM | POA: Diagnosis not present

## 2016-07-30 ENCOUNTER — Ambulatory Visit (INDEPENDENT_AMBULATORY_CARE_PROVIDER_SITE_OTHER): Payer: Worker's Compensation | Admitting: Specialist

## 2016-07-30 DIAGNOSIS — M48061 Spinal stenosis, lumbar region without neurogenic claudication: Secondary | ICD-10-CM

## 2016-07-31 ENCOUNTER — Ambulatory Visit (INDEPENDENT_AMBULATORY_CARE_PROVIDER_SITE_OTHER): Payer: BLUE CROSS/BLUE SHIELD | Admitting: Orthopaedic Surgery

## 2016-07-31 ENCOUNTER — Other Ambulatory Visit (INDEPENDENT_AMBULATORY_CARE_PROVIDER_SITE_OTHER): Payer: Self-pay | Admitting: Orthopaedic Surgery

## 2016-07-31 DIAGNOSIS — M25562 Pain in left knee: Secondary | ICD-10-CM

## 2016-07-31 DIAGNOSIS — R531 Weakness: Secondary | ICD-10-CM

## 2016-08-09 ENCOUNTER — Ambulatory Visit
Admission: RE | Admit: 2016-08-09 | Discharge: 2016-08-09 | Disposition: A | Discharge status: Home / Self Care | Payer: BLUE CROSS/BLUE SHIELD | Source: Ambulatory Visit | Attending: Orthopaedic Surgery | Admitting: Orthopaedic Surgery

## 2016-08-09 DIAGNOSIS — R531 Weakness: Secondary | ICD-10-CM

## 2016-08-09 DIAGNOSIS — M25562 Pain in left knee: Secondary | ICD-10-CM

## 2016-08-10 ENCOUNTER — Telehealth (INDEPENDENT_AMBULATORY_CARE_PROVIDER_SITE_OTHER): Payer: Self-pay | Admitting: Specialist

## 2016-08-10 NOTE — Telephone Encounter (Signed)
Patient states that he is having severe pain in the middle of back and shooting back down to the lumbar part of his back and also left hand is having numbness and tingling and want to make Dr. Louanne Skye aware of this. Patient states that he is getting ready to have surgery on his back. Patient states that did not need to talk to triage wants to see what Dr. Louanne Skye says.  Contact Info: [phone]

## 2016-08-10 NOTE — Telephone Encounter (Signed)
Please advise, see msg below.

## 2016-08-11 NOTE — Telephone Encounter (Signed)
Pt. Called stating he would like to discuss having surgery on his back and hand. Pt requesting call back.

## 2016-08-12 ENCOUNTER — Telehealth (INDEPENDENT_AMBULATORY_CARE_PROVIDER_SITE_OTHER): Payer: Self-pay

## 2016-08-12 NOTE — Telephone Encounter (Signed)
See message below  Please advise

## 2016-08-12 NOTE — Telephone Encounter (Signed)
I called Nathan Aguilar and told him that we can perform surgery on the left wrist at the same time that he has surgery on his back. That is something that can be done but the cost of the wrist surgery will likely be his responsibility as the left arm EMG/NCV shows improvement in the left carpal tunnel syndrome findings compared with studies from 2015. 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 He will let me know if he wants to have the additional left wrist surgery done. jen

## 2016-08-12 NOTE — Telephone Encounter (Signed)
Patient would like to know if Dr Louanne Skye could do surgery on his hand at the same time he has surgery on his back.  FYI he also has a upcoming apt with Aaron Edelman regarding his Knee. Please call patient.

## 2016-08-13 ENCOUNTER — Telehealth (INDEPENDENT_AMBULATORY_CARE_PROVIDER_SITE_OTHER): Payer: Self-pay | Admitting: Specialist

## 2016-08-13 NOTE — Telephone Encounter (Signed)
Noted,  Thank you!

## 2016-08-13 NOTE — Telephone Encounter (Signed)
Pt. Called and wanted to let Dr. Louanne Skye know that he wants to go ahead and have a Lt. CTR with his spine surgery.

## 2016-08-13 NOTE — Telephone Encounter (Signed)
See msg below

## 2016-08-14 ENCOUNTER — Telehealth (INDEPENDENT_AMBULATORY_CARE_PROVIDER_SITE_OTHER): Payer: Self-pay | Admitting: Specialist

## 2016-08-14 NOTE — Telephone Encounter (Signed)
Received letter from Welford Roche, [facility], patient's primary care, stating patient is not cleared for surgery at this time.  He had an abnormal chest x-ray and CT so they are referring him for evaluation for that before clearing him.  I advised patient and he stated he was aware that they are sending him to a pulmonologist.  I told him to let us know when a determination is made that surgery can be done.

## 2016-08-17 NOTE — Telephone Encounter (Signed)
Hold on surgery until cleared. Let me know if surgery becomes cleared.

## 2016-08-19 ENCOUNTER — Ambulatory Visit (INDEPENDENT_AMBULATORY_CARE_PROVIDER_SITE_OTHER): Payer: Self-pay | Admitting: Specialist

## 2016-08-25 NOTE — Telephone Encounter (Signed)
Patient came by office 08/25/16 at 1:44 pm to request a note for to keep him out of work until surgery. Also wants to know if there are any other ways he can have surgery that doesn't involve him laying on his stomach.  Cb#: [phone]

## 2016-08-26 ENCOUNTER — Encounter: Payer: Self-pay | Admitting: Pulmonary Disease

## 2016-08-26 ENCOUNTER — Ambulatory Visit (INDEPENDENT_AMBULATORY_CARE_PROVIDER_SITE_OTHER): Payer: BLUE CROSS/BLUE SHIELD | Admitting: Orthopedic Surgery

## 2016-08-26 ENCOUNTER — Encounter (INDEPENDENT_AMBULATORY_CARE_PROVIDER_SITE_OTHER): Payer: Self-pay | Admitting: Orthopedic Surgery

## 2016-08-26 ENCOUNTER — Ambulatory Visit (INDEPENDENT_AMBULATORY_CARE_PROVIDER_SITE_OTHER): Payer: BLUE CROSS/BLUE SHIELD | Admitting: Pulmonary Disease

## 2016-08-26 VITALS — BP 122/76 | HR 87 | Ht 72.0 in | Wt 310.6 lb

## 2016-08-26 VITALS — BP 145/76 | HR 112 | Resp 18 | Ht 72.0 in | Wt 305.0 lb

## 2016-08-26 DIAGNOSIS — M7652 Patellar tendinitis, left knee: Secondary | ICD-10-CM

## 2016-08-26 DIAGNOSIS — R911 Solitary pulmonary nodule: Secondary | ICD-10-CM

## 2016-08-26 MED ORDER — DICLOFENAC SODIUM 1 % TD GEL
2.0000 g | Freq: Four times a day (QID) | TRANSDERMAL | 1 refills | Status: DC
Start: 2016-08-26 — End: 2023-06-16

## 2016-08-26 NOTE — Telephone Encounter (Signed)
See msg below, please advise.  Thanks.

## 2016-08-26 NOTE — Patient Instructions (Signed)
You are cleared to undergo surgery from a pulmonary standpoint. We will order a CT of the chest without contrast in 1 year's time for follow-up of the pulmonary nodule.  Advised to quit smoking. Follow up in clinic after CT of the chest.

## 2016-08-26 NOTE — Telephone Encounter (Signed)
Mr. Nathan Aguilar would like to have a left open carpal release at the time of the lumbar surgery. This is okay with me and he is aware that the carpal tunnel surgery can not be associated with his injury on the job and he will be billed for this surger

## 2016-08-26 NOTE — Progress Notes (Signed)
Nathan Aguilar    RC:[id number]    08-24-1971  Primary Care Physician:Dwight Carmelia Roller, MD  Referring Physician: Welford Roche, NP [address] [place], [place] [address]  Chief complaint:   Consult for evaluation of abnormal CT scan.  HPI: Nathan Aguilar is a 45 year old with past medical history of anxiety, depression. He had a fall with back injury at work in July 2017. He has spinal stenosis and is being considered for spinal surgery. He had a CT and chest x-ray which showed pulmonary nodules. He has been referred for further evaluation before surgery. CT scan images reviewed-they show a small right-sided 4 mm nodule.  He works as a Freight forwarder at [employer]. He smokes about 1 pack per day. He has dyspnea on exertion, chronic cough with sputum production. He denies any chest pain, palpitations, fevers, chills. Denies any snoring, excessive daytime sleepiness. He is maintained on albuterol and arnuity   Outpatient Encounter Prescriptions as of 08/26/2016  Medication Sig  . albuterol (PROAIR HFA) 108 (90 Base) MCG/ACT inhaler Inhale 2 puffs into the lungs every 6 (six) hours as needed for wheezing or shortness of breath.  . ALPRAZolam (XANAX) 1 MG tablet Take 1 mg by mouth 3 (three) times daily as needed for anxiety.  Marland Kitchen atorvastatin (LIPITOR) 40 MG tablet Take 40 mg by mouth daily.  . Cholecalciferol (VITAMIN D) 2000 units CAPS Take 1 capsule by mouth daily.  Marland Kitchen desvenlafaxine (PRISTIQ) 50 MG 24 hr tablet Take 50 mg by mouth daily.    . diazepam (VALIUM) 10 MG tablet Take 10 mg by mouth daily as needed. For anxiety.   Marland Kitchen escitalopram (LEXAPRO) 20 MG tablet Take 20 mg by mouth daily.  Marland Kitchen ezetimibe (ZETIA) 10 MG tablet Take 10 mg by mouth daily.  . Fluticasone Furoate (ARNUITY ELLIPTA) 100 MCG/ACT AEPB Inhale 1 puff into the lungs daily.  . furosemide (LASIX) 40 MG tablet Take 40 mg by mouth.  . levothyroxine (SYNTHROID, LEVOTHROID) 75 MCG tablet Take 75 mcg by mouth daily  before breakfast.  . lisinopril-hydrochlorothiazide (PRINZIDE,ZESTORETIC) 20-12.5 MG tablet Take 1 tablet by mouth daily.  Marland Kitchen oxyCODONE-acetaminophen (PERCOCET) 7.5-325 MG tablet Take 1 tablet by mouth every 4 (four) hours as needed for severe pain.  Marland Kitchen temazepam (RESTORIL) 30 MG capsule Take 30 mg by mouth at bedtime as needed for sleep.  Marland Kitchen venlafaxine XR (EFFEXOR-XR) 75 MG 24 hr capsule Take 75 mg by mouth daily with breakfast.  . [DISCONTINUED] lisinopril (PRINIVIL,ZESTRIL) 10 MG tablet Take 10 mg by mouth daily.  . [DISCONTINUED] DULoxetine (CYMBALTA) 60 MG capsule Take 60 mg by mouth daily.    . [DISCONTINUED] QUEtiapine (SEROQUEL) 25 MG tablet Take 25 mg by mouth daily.     No facility-administered encounter medications on file as of 08/26/2016.     Allergies as of 08/26/2016 - Review Complete 08/26/2016  Allergen Reaction Noted  . Penicillins Hives 10/16/2011    Past Medical History:  Diagnosis Date  . Anxiety   . Depression   . Seizures ([place])    as child    Past Surgical History:  Procedure Laterality Date  . ANTERIOR CERVICAL DECOMP/DISCECTOMY FUSION  10/23/2011   Procedure: ANTERIOR CERVICAL DECOMPRESSION/DISCECTOMY FUSION 1 LEVEL;  Surgeon: [name], MD;  Location: [facility];  Service: Orthopedics;  Laterality: N/A;  Anterior cervical discectomy fusion C6-7 with local bone graft, plate, screws, Allograft bone graft  . CARPAL TUNNEL RELEASE     right  .  CARPAL TUNNEL RELEASE  10/23/2011   Procedure: CARPAL TUNNEL RELEASE;  Surgeon: [name], MD;  Location: [facility];  Service: Orthopedics;  Laterality: Left;  left carpal tunnel release  . PATELLA RECONSTRUCTION     arthroscopic bilateral knee cap    Family History  Problem Relation Age of Onset  . Leukemia Mother   . Heart disease Father     Social History   Social History  . Marital status: Single    Spouse name: N/A  . Number of children: N/A  . Years of education: N/A   Occupational History  . Not on  file.   Social History Main Topics  . Smoking status: Current Every Day Smoker    Packs/day: 1.00    Years: 30.00    Types: Cigarettes  . Smokeless tobacco: Never Used  . Alcohol use No  . Drug use: No  . Sexual activity: Not on file   Other Topics Concern  . 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  HENT: Negative.   Eyes: Negative for blurred vision.  Respiratory: as per HPI  Cardiovascular: Negative for chest pain and palpitations.  Gastrointestinal: Negative for vomiting, diarrhea, blood per rectum. Genitourinary: Negative for dysuria, urgency, frequency and hematuria.  Musculoskeletal: Negative for myalgias, back pain and joint pain.  Skin: Negative for itching and rash.  Neurological: Negative for dizziness, tremors, focal weakness, seizures and loss of consciousness.  Endo/Heme/Allergies: Negative for environmental allergies.  Psychiatric/Behavioral: Negative for depression, suicidal ideas and hallucinations.  All other systems reviewed and are negative.   Physical Exam: Blood pressure 122/76, pulse 87, height 6' (1.829 m), weight (!) 310 lb 9.6 oz (140.9 kg), SpO2 94 %. Gen:      No acute distress HEENT:  EOMI, sclera anicteric Neck:     No masses; no thyromegaly Lungs:    Clear to auscultation bilaterally; normal respiratory effort CV:         Regular rate and rhythm; no murmurs Abd:      + bowel sounds; soft, non-tender; no palpable masses, no distension Ext:    No edema; adequate peripheral perfusion Skin:      Warm and dry; no rash Neuro: alert and oriented x 3 Psych: normal mood and affect  Data Reviewed: CT chest 11l/2/1 7- 4 millimeter nodule in right horizontal fissure, adrenal gland adenoma, multiple ventricular contractures.  Images personally reviewed.  Assessment:  Evaluation for abnormal CT scan  CT scan images personally reviewed. It shows a small 4 mm nodule in the right lung. This will need to be followed up with a repeat CT next year as he is at higher risk for malignancy given his smoking history, history of skin cancer.   He is at low-intermediate risk of perioperative pulmonary complications given his obesity, smoking history. However he is cleared from a pulmonary standpoint to go ahead with the orthopedic spine surgery. Suggest early mobilization, adequate pain control, use of incentive spirometry. I have advised him to quit smoking. We will get PFTs for a baseline evaluation  Plan/Recommendations: - Follow up CT of  chest without contrast in one year.  - PFTs - Smoking cessation   Follow up in clinic  after CT.   Marshell Garfinkel MD [facility] Pager [phone] 08/26/2016, 10:05 AM  CC: Welford Roche, NP

## 2016-08-26 NOTE — Progress Notes (Signed)
Office Visit Note   Patient: Nathan Aguilar           Date of Birth: 04/20/1971           MRN: [id number] Visit Date: 08/26/2016              Requested by: Welford Roche, NP [place] [place], [place] [address] PCP: Harvie Junior, MD   Assessment & Plan: Visit Diagnoses:  1. Patellar tendonitis of left knee     Plan:  #1: Voltaren gel approved by Dr.Nitka #2: I feel that his symptoms of giving way may be secondary to his spinal stenosis and therefore hopefully surgical intervention lumbar spine will help him  Follow-Up Instructions: Return if symptoms worsen or fail to improve.   Orders:  No orders of the defined types were placed in this encounter.  Meds ordered this encounter  Medications  . diclofenac sodium (VOLTAREN) 1 % GEL    Sig: Apply 2-4 g topically 4 (four) times daily.    Dispense:  5 Tube    Refill:  1    Order Specific Question:   Supervising Provider    Answer:   Garald Balding [[id number]]      Procedures: No procedures performed   Clinical Data: No additional findings.   Subjective: Chief Complaint  Patient presents with  . Left Knee - Pain    Nathan Aguilar is seen today for follow-up of his left knee MRI scan. He states that he had an accident back in July early 2017 while he was at work.  He was taking a mop bucket and apparently it got caught and he went flying over the bucket landing on both knees and both upper extremities.    He was seen at on 07/29/2016 where he stated that his knee causes him pain both in the medial aspect and the anterior portion of the knee.  It hurts to walk.  He does not have any giving way symptoms at this time.  He though does have some catching.  He denies any neurovascular compromise.  The problem is that his pain is constant and worsens with any activities.  He even has some nighttime pain.  He does have swelling of both lower extremities. He states that he has feelings of giving way in the knee.  He  also has been seeing Dr. Louanne Skye, who was planning to perform exploration and decompression of his lumbar spine for apparent spinal stenosis     Review of Systems  Constitutional: Negative.   HENT: Negative.   Eyes: Negative.   Respiratory: Negative.   Cardiovascular:       Hypertension  Gastrointestinal:       Reflux  Endocrine: Negative.   Genitourinary: Negative.   Skin:       History of skin cancer  Neurological: Negative.   Hematological: Negative.   Psychiatric/Behavioral:       Anxiety and epilepsy     Objective: Vital Signs: BP (!) 145/76 (BP Location: Left Arm, Patient Position: Sitting, Cuff Size: Large)   Pulse (!) 112   Resp 18   Ht 6' (1.829 m)   Wt (!) 305 lb (138.3 kg)   BMI 41.37 kg/m   Physical Exam  Constitutional: He is oriented to person, place, and time. He appears well-developed and well-nourished.  HENT:  Head: Normocephalic and atraumatic.  Eyes: EOM are normal. Pupils are equal, round, and reactive to light.  Neck:  No carotid bruits  Pulmonary/Chest: Effort normal.  Neurological: He is alert and oriented to person, place, and time.  Skin: Skin is warm and dry.  Psychiatric: He has a normal mood and affect. His behavior is normal. Judgment and thought content normal.    Left Knee Exam   Tenderness  The patient is experiencing tenderness in the lateral joint line and medial joint line.  Range of Motion  Extension: 0  Flexion: 110   Tests  Lachman:  Anterior - trace     Drawer:       Anterior - trace       Other  Swelling: none      Specialty Comments:  No specialty comments available.  Imaging: EXAM: MRI OF THE LEFT KNEE WITHOUT CONTRAST  TECHNIQUE: Multiplanar, multisequence MR imaging of the knee was performed. No intravenous contrast was administered.  COMPARISON:  Radiographs dated 07/29/2016  FINDINGS: MENISCI  Medial meniscus:  Intact.  Lateral meniscus:  Intact.  LIGAMENTS  Cruciates:  Intact  ACL and PCL.  Collaterals: Medial collateral ligament is intact. Lateral collateral ligament complex is intact.  CARTILAGE  Patellofemoral:  No chondral defect.  Medial:  Normal.  Lateral:  Normal.  Joint:  Minimal joint effusion.  Popliteal Fossa:  Minimal Baker's cyst.  Extensor Mechanism: Minimal inflammation of proximal patellar tendon of the lower pole of the patella. Otherwise normal.  Bones:  Normal.  Other: None  IMPRESSION: 1. Small nonspecific joint effusion. 2. Slight inflammation at the origin of the patellar tendon from the lower pole of the patella.   Electronically Signed   By: Lorriane Shire M.D.    PMFS History: Patient Active Problem List   Diagnosis Date Noted  . HNP (herniated nucleus pulposus), cervical 10/19/2011    Class: History of  . Carpal tunnel syndrome, left 10/19/2011    Class: Diagnosis of   Past Medical History:  Diagnosis Date  . Anxiety   . Depression   . Seizures ([place])    as child    Family History  Problem Relation Age of Onset  . Leukemia Mother   . Heart disease Father     Past Surgical History:  Procedure Laterality Date  . ANTERIOR CERVICAL DECOMP/DISCECTOMY FUSION  10/23/2011   Procedure: ANTERIOR CERVICAL DECOMPRESSION/DISCECTOMY FUSION 1 LEVEL;  Surgeon: [name], MD;  Location: [facility];  Service: Orthopedics;  Laterality: N/A;  Anterior cervical discectomy fusion C6-7 with local bone graft, plate, screws, Allograft bone graft  . CARPAL TUNNEL RELEASE     right  . CARPAL TUNNEL RELEASE  10/23/2011   Procedure: CARPAL TUNNEL RELEASE;  Surgeon: [name], MD;  Location: [facility];  Service: Orthopedics;  Laterality: Left;  left carpal tunnel release  . PATELLA RECONSTRUCTION     arthroscopic bilateral knee cap   Social History   Occupational History  . Not on file.   Social History Main Topics  . Smoking status: Current Every Day Smoker    Packs/day: 1.00    Years: 25.00    Types:  Cigarettes  . Smokeless tobacco: Never Used     Comment: 1/2 TO 1 PACK PER DAY  . Alcohol use No  . Drug use: No  . Sexual activity: Not on file

## 2016-08-28 ENCOUNTER — Telehealth (INDEPENDENT_AMBULATORY_CARE_PROVIDER_SITE_OTHER): Payer: Self-pay | Admitting: Specialist

## 2016-08-28 NOTE — Telephone Encounter (Signed)
Patient wanted to let you know that he has spoken with his attorney about his injury involving the neck that was work related. Date of Injury July 30,2017.   Patient would like to schedule surgery ASAP even if he must file with his medical insurance. This matter is going to mediation Oct 28, 2016 if you will state that you are 100% sure the pain is coming from the neck.

## 2016-08-28 NOTE — Progress Notes (Signed)
Pulmonary clearance on Mr. Nathan Aguilar, okay for lumbar and carpal tunnel surgery. jen

## 2016-08-28 NOTE — Telephone Encounter (Signed)
See msg below - FYI

## 2016-08-28 NOTE — Telephone Encounter (Signed)
We are planning to perform surgey on his lumbar spine (low back) not his neck and on his carpal tunnel for findings that suggest they are the source of his pain. The nerve test in his are showed carpal tunnel syndrome but not as severe as in a  EMG/NCV test he had several years ago that showed carpal tunnel syndrome. The lumbar MRI shows Degenerative changes and arthrits causing spinal canal narrowing or stenosis. I believe that the condition was exacerbated with his injury but that the stenosis was probably there before the fall.

## 2016-08-31 ENCOUNTER — Encounter (HOSPITAL_COMMUNITY)
Admission: RE | Admit: 2016-08-31 | Discharge: 2016-08-31 | Disposition: A | Discharge status: Home / Self Care | Payer: Worker's Compensation | Source: Ambulatory Visit | Attending: Specialist | Admitting: Specialist

## 2016-08-31 ENCOUNTER — Encounter (HOSPITAL_COMMUNITY): Payer: Self-pay

## 2016-08-31 DIAGNOSIS — M48062 Spinal stenosis, lumbar region with neurogenic claudication: Secondary | ICD-10-CM | POA: Diagnosis not present

## 2016-08-31 DIAGNOSIS — F17210 Nicotine dependence, cigarettes, uncomplicated: Secondary | ICD-10-CM | POA: Diagnosis not present

## 2016-08-31 DIAGNOSIS — F419 Anxiety disorder, unspecified: Secondary | ICD-10-CM | POA: Diagnosis not present

## 2016-08-31 DIAGNOSIS — G5602 Carpal tunnel syndrome, left upper limb: Secondary | ICD-10-CM | POA: Diagnosis not present

## 2016-08-31 DIAGNOSIS — Z6841 Body Mass Index (BMI) 40.0 and over, adult: Secondary | ICD-10-CM | POA: Diagnosis not present

## 2016-08-31 DIAGNOSIS — E039 Hypothyroidism, unspecified: Secondary | ICD-10-CM | POA: Diagnosis not present

## 2016-08-31 DIAGNOSIS — Z7982 Long term (current) use of aspirin: Secondary | ICD-10-CM | POA: Diagnosis not present

## 2016-08-31 DIAGNOSIS — J95821 Acute postprocedural respiratory failure: Secondary | ICD-10-CM | POA: Diagnosis not present

## 2016-08-31 DIAGNOSIS — I10 Essential (primary) hypertension: Secondary | ICD-10-CM | POA: Diagnosis not present

## 2016-08-31 DIAGNOSIS — Z981 Arthrodesis status: Secondary | ICD-10-CM | POA: Diagnosis not present

## 2016-08-31 DIAGNOSIS — Z79899 Other long term (current) drug therapy: Secondary | ICD-10-CM | POA: Diagnosis not present

## 2016-08-31 DIAGNOSIS — F329 Major depressive disorder, single episode, unspecified: Secondary | ICD-10-CM | POA: Diagnosis not present

## 2016-08-31 DIAGNOSIS — G9741 Accidental puncture or laceration of dura during a procedure: Secondary | ICD-10-CM | POA: Diagnosis not present

## 2016-08-31 HISTORY — DX: Unspecified asthma, uncomplicated: J45.909

## 2016-08-31 HISTORY — DX: Essential (primary) hypertension: I10

## 2016-08-31 HISTORY — DX: Dyspnea, unspecified: R06.00

## 2016-08-31 HISTORY — DX: Hypothyroidism, unspecified: E03.9

## 2016-08-31 LAB — BASIC METABOLIC PANEL
ANION GAP: 10 (ref 5–15)
BUN: 11 mg/dL (ref 6–20)
CALCIUM: 9.3 mg/dL (ref 8.9–10.3)
CO2: 25 mmol/L (ref 22–32)
CREATININE: 0.95 mg/dL (ref 0.61–1.24)
Chloride: 101 mmol/L (ref 101–111)
Glucose, Bld: 173 mg/dL — ABNORMAL HIGH (ref 65–99)
Potassium: 3.7 mmol/L (ref 3.5–5.1)
SODIUM: 136 mmol/L (ref 135–145)

## 2016-08-31 LAB — CBC
HCT: 43.8 % (ref 39.0–52.0)
Hemoglobin: 14.5 g/dL (ref 13.0–17.0)
MCH: 29.7 pg (ref 26.0–34.0)
MCHC: 33.1 g/dL (ref 30.0–36.0)
MCV: 89.6 fL (ref 78.0–100.0)
Platelets: 261 K/uL (ref 150–400)
RBC: 4.89 MIL/uL (ref 4.22–5.81)
RDW: 14.5 % (ref 11.5–15.5)
WBC: 12.1 K/uL — ABNORMAL HIGH (ref 4.0–10.5)

## 2016-08-31 LAB — SURGICAL PCR SCREEN
MRSA, PCR: NEGATIVE
Staphylococcus aureus: NEGATIVE

## 2016-08-31 NOTE — Telephone Encounter (Signed)
Patient is scheduled for surgery tomorrow.

## 2016-08-31 NOTE — Pre-Procedure Instructions (Signed)
    Nathan Aguilar  08/31/2016      [facility] [address], [place] - [address] [place] [address] Phone: [phone] Fax: [phone]  [id number] - [place], [place] [address] Phone: [phone] Fax: [phone]    Your procedure is scheduled on 09/01/16.  Report to [facility] Admitting at 7 A.M.  Call this number if you have problems the morning of surgery:  [phone]   Remember:  Do not eat food or drink liquids after midnight.  Take these medicines the morning of surgery with A SIP OF WATER --all inhalers,xanax,lexapro,synthroid,oxycodone,effexor   Do not wear jewelry, make-up or nail polish.  Do not wear lotions, powders, or perfumes, or deoderant.  Do not shave 48 hours prior to surgery.  Men may shave face and neck.  Do not bring valuables to the hospital.  [facility] is not responsible for any belongings or valuables.  Contacts, dentures or bridgework may not be worn into surgery.  Leave your suitcase in the car.  After surgery it may be brought to your room.  For patients admitted to the hospital, discharge time will be determined by your treatment team.  Patients discharged the day of surgery will not be allowed to drive home.   Name and phone number of your driver:    Special instructions: Do not take any aspirin,anti-inflammatories,vitamins,or herbal supplements 5-7 days prior to surgery.  Please read over the following fact sheets that you were given.

## 2016-09-01 ENCOUNTER — Ambulatory Visit (HOSPITAL_COMMUNITY): Payer: Worker's Compensation | Admitting: Anesthesiology

## 2016-09-01 ENCOUNTER — Ambulatory Visit (HOSPITAL_COMMUNITY): Payer: Worker's Compensation

## 2016-09-01 ENCOUNTER — Observation Stay (HOSPITAL_COMMUNITY)
Admission: RE | Admit: 2016-09-01 | Discharge: 2016-09-02 | Disposition: A | Discharge status: Home / Self Care | Payer: Worker's Compensation | Source: Ambulatory Visit | Attending: Specialist | Admitting: Specialist

## 2016-09-01 ENCOUNTER — Encounter (HOSPITAL_COMMUNITY)
Admission: RE | Disposition: A | Discharge status: Home / Self Care | Payer: Self-pay | Source: Ambulatory Visit | Attending: Specialist

## 2016-09-01 ENCOUNTER — Encounter (HOSPITAL_COMMUNITY): Payer: Self-pay | Admitting: *Deleted

## 2016-09-01 DIAGNOSIS — G4733 Obstructive sleep apnea (adult) (pediatric): Secondary | ICD-10-CM | POA: Diagnosis present

## 2016-09-01 DIAGNOSIS — M48062 Spinal stenosis, lumbar region with neurogenic claudication: Principal | ICD-10-CM | POA: Diagnosis present

## 2016-09-01 DIAGNOSIS — J811 Chronic pulmonary edema: Secondary | ICD-10-CM

## 2016-09-01 DIAGNOSIS — I10 Essential (primary) hypertension: Secondary | ICD-10-CM | POA: Diagnosis present

## 2016-09-01 DIAGNOSIS — J95821 Acute postprocedural respiratory failure: Secondary | ICD-10-CM | POA: Diagnosis not present

## 2016-09-01 DIAGNOSIS — Z79899 Other long term (current) drug therapy: Secondary | ICD-10-CM | POA: Insufficient documentation

## 2016-09-01 DIAGNOSIS — F329 Major depressive disorder, single episode, unspecified: Secondary | ICD-10-CM | POA: Diagnosis present

## 2016-09-01 DIAGNOSIS — G5602 Carpal tunnel syndrome, left upper limb: Secondary | ICD-10-CM | POA: Diagnosis not present

## 2016-09-01 DIAGNOSIS — G9600 Cerebrospinal fluid leak, unspecified: Secondary | ICD-10-CM | POA: Diagnosis not present

## 2016-09-01 DIAGNOSIS — F419 Anxiety disorder, unspecified: Secondary | ICD-10-CM | POA: Insufficient documentation

## 2016-09-01 DIAGNOSIS — Z7982 Long term (current) use of aspirin: Secondary | ICD-10-CM | POA: Insufficient documentation

## 2016-09-01 DIAGNOSIS — G9741 Accidental puncture or laceration of dura during a procedure: Secondary | ICD-10-CM | POA: Insufficient documentation

## 2016-09-01 DIAGNOSIS — J9601 Acute respiratory failure with hypoxia: Secondary | ICD-10-CM | POA: Diagnosis present

## 2016-09-01 DIAGNOSIS — R0902 Hypoxemia: Secondary | ICD-10-CM

## 2016-09-01 DIAGNOSIS — J810 Acute pulmonary edema: Secondary | ICD-10-CM | POA: Diagnosis present

## 2016-09-01 DIAGNOSIS — F17210 Nicotine dependence, cigarettes, uncomplicated: Secondary | ICD-10-CM | POA: Insufficient documentation

## 2016-09-01 DIAGNOSIS — Z6841 Body Mass Index (BMI) 40.0 and over, adult: Secondary | ICD-10-CM | POA: Insufficient documentation

## 2016-09-01 DIAGNOSIS — Z981 Arthrodesis status: Secondary | ICD-10-CM | POA: Insufficient documentation

## 2016-09-01 DIAGNOSIS — F32A Depression, unspecified: Secondary | ICD-10-CM | POA: Diagnosis present

## 2016-09-01 DIAGNOSIS — Z419 Encounter for procedure for purposes other than remedying health state, unspecified: Secondary | ICD-10-CM

## 2016-09-01 DIAGNOSIS — E039 Hypothyroidism, unspecified: Secondary | ICD-10-CM | POA: Diagnosis present

## 2016-09-01 DIAGNOSIS — Z9981 Dependence on supplemental oxygen: Secondary | ICD-10-CM

## 2016-09-01 HISTORY — PX: HEMILAMINOTOMY LUMBAR SPINE: SUR654

## 2016-09-01 HISTORY — DX: Unspecified chronic bronchitis: J42

## 2016-09-01 HISTORY — PX: LUMBAR LAMINECTOMY: SHX95

## 2016-09-01 HISTORY — DX: Low back pain: M54.5

## 2016-09-01 HISTORY — DX: Gastro-esophageal reflux disease without esophagitis: K21.9

## 2016-09-01 HISTORY — DX: Cardiac murmur, unspecified: R01.1

## 2016-09-01 HISTORY — DX: Other generalized epilepsy and epileptic syndromes, not intractable, without status epilepticus: G40.409

## 2016-09-01 HISTORY — DX: Basal cell carcinoma of skin, unspecified: C44.91

## 2016-09-01 HISTORY — DX: Low back pain, unspecified: M54.50

## 2016-09-01 HISTORY — DX: Pure hypercholesterolemia, unspecified: E78.00

## 2016-09-01 HISTORY — DX: Other chronic pain: G89.29

## 2016-09-01 HISTORY — DX: Pneumonia, unspecified organism: J18.9

## 2016-09-01 LAB — URINALYSIS, ROUTINE W REFLEX MICROSCOPIC
Bilirubin Urine: NEGATIVE
GLUCOSE, UA: NEGATIVE mg/dL
HGB URINE DIPSTICK: NEGATIVE
Ketones, ur: NEGATIVE mg/dL
Leukocytes, UA: NEGATIVE
Nitrite: NEGATIVE
PROTEIN: NEGATIVE mg/dL
Specific Gravity, Urine: 1.009 (ref 1.005–1.030)
pH: 6 (ref 5.0–8.0)

## 2016-09-01 LAB — HEPATIC FUNCTION PANEL
ALK PHOS: 76 U/L (ref 38–126)
ALT: 25 U/L (ref 17–63)
AST: 23 U/L (ref 15–41)
Albumin: 3.6 g/dL (ref 3.5–5.0)
BILIRUBIN DIRECT: 0.1 mg/dL (ref 0.1–0.5)
BILIRUBIN INDIRECT: 0.2 mg/dL — AB (ref 0.3–0.9)
Total Bilirubin: 0.3 mg/dL (ref 0.3–1.2)
Total Protein: 6.4 g/dL — ABNORMAL LOW (ref 6.5–8.1)

## 2016-09-01 LAB — BLOOD GAS, ARTERIAL
ACID-BASE EXCESS: 2.3 mmol/L — AB (ref 0.0–2.0)
BICARBONATE: 27.1 mmol/L (ref 20.0–28.0)
DRAWN BY: 448981
FIO2: 60
O2 Saturation: 91.5 %
PCO2 ART: 46.9 mmHg (ref 32.0–48.0)
PO2 ART: 61.6 mmHg — AB (ref 83.0–108.0)
PRESSURE CONTROL: 12 cmH2O
Patient temperature: 97.5
RATE: 15 resp/min
pH, Arterial: 7.377 (ref 7.350–7.450)

## 2016-09-01 LAB — APTT: APTT: 43 s — AB (ref 24–36)

## 2016-09-01 LAB — PROTIME-INR
INR: 1.06
PROTHROMBIN TIME: 13.9 s (ref 11.4–15.2)

## 2016-09-01 LAB — BRAIN NATRIURETIC PEPTIDE: B Natriuretic Peptide: 9.7 pg/mL (ref 0.0–100.0)

## 2016-09-01 LAB — TROPONIN I: TROPONIN I: 0.03 ng/mL — AB (ref <0.03)

## 2016-09-01 SURGERY — MICRODISCECTOMY LUMBAR LAMINECTOMY
Anesthesia: General | Site: Spine Lumbar | Laterality: Left

## 2016-09-01 MED ORDER — LISINOPRIL-HYDROCHLOROTHIAZIDE 20-12.5 MG PO TABS: 1.0000 | ORAL_TABLET | Freq: Every day | ORAL | Status: DC

## 2016-09-01 MED ORDER — ONDANSETRON HCL 4 MG/2ML IJ SOLN
INTRAMUSCULAR | Status: AC
  Filled 2016-09-01: qty 2

## 2016-09-01 MED ORDER — ASPIRIN EC 81 MG PO TBEC
81.0000 mg | DELAYED_RELEASE_TABLET | Freq: Every day | ORAL | Status: DC
  Administered 2016-09-01 – 2016-09-02 (×2): 81 mg via ORAL
  Filled 2016-09-01 (×2): qty 1

## 2016-09-01 MED ORDER — LEVOTHYROXINE SODIUM 75 MCG PO TABS
75.0000 ug | ORAL_TABLET | Freq: Every day | ORAL | Status: DC
  Administered 2016-09-02: 75 ug via ORAL
  Filled 2016-09-01: qty 1

## 2016-09-01 MED ORDER — FUROSEMIDE 10 MG/ML IJ SOLN
40.0000 mg | Freq: Two times a day (BID) | INTRAMUSCULAR | Status: DC
  Administered 2016-09-02: 40 mg via INTRAVENOUS
  Filled 2016-09-01: qty 4

## 2016-09-01 MED ORDER — FENTANYL CITRATE (PF) 100 MCG/2ML IJ SOLN
INTRAMUSCULAR | Status: AC
  Filled 2016-09-01: qty 4

## 2016-09-01 MED ORDER — OXYCODONE-ACETAMINOPHEN 5-325 MG PO TABS: 1.0000 | ORAL_TABLET | ORAL | Status: DC | PRN

## 2016-09-01 MED ORDER — VANCOMYCIN HCL 10 G IV SOLR
1500.0000 mg | INTRAVENOUS | Status: AC
  Administered 2016-09-01: 1500 mg via INTRAVENOUS
  Filled 2016-09-01: qty 1500

## 2016-09-01 MED ORDER — VITAMIN D 1000 UNITS PO TABS
2000.0000 [IU] | ORAL_TABLET | Freq: Every day | ORAL | Status: DC
  Administered 2016-09-01 – 2016-09-02 (×2): 2000 [IU] via ORAL
  Filled 2016-09-01 (×2): qty 2

## 2016-09-01 MED ORDER — SUCCINYLCHOLINE CHLORIDE 200 MG/10ML IV SOSY
PREFILLED_SYRINGE | INTRAVENOUS | Status: AC
  Filled 2016-09-01: qty 10

## 2016-09-01 MED ORDER — LIDOCAINE 2% (20 MG/ML) 5 ML SYRINGE
INTRAMUSCULAR | Status: AC
  Filled 2016-09-01: qty 5

## 2016-09-01 MED ORDER — MORPHINE SULFATE (PF) 2 MG/ML IV SOLN: 1.0000 mg | INTRAVENOUS | Status: DC | PRN

## 2016-09-01 MED ORDER — TEMAZEPAM 30 MG PO CAPS: 30.0000 mg | ORAL_CAPSULE | Freq: Every evening | ORAL | Status: DC | PRN

## 2016-09-01 MED ORDER — ALPRAZOLAM 0.5 MG PO TABS
1.0000 mg | ORAL_TABLET | Freq: Three times a day (TID) | ORAL | Status: DC | PRN
  Administered 2016-09-01: 1 mg via ORAL

## 2016-09-01 MED ORDER — ROCURONIUM BROMIDE 10 MG/ML (PF) SYRINGE
PREFILLED_SYRINGE | INTRAVENOUS | Status: AC
  Filled 2016-09-01: qty 10

## 2016-09-01 MED ORDER — PROPOFOL 10 MG/ML IV BOLUS
INTRAVENOUS | Status: AC
  Filled 2016-09-01: qty 20

## 2016-09-01 MED ORDER — FENTANYL CITRATE (PF) 100 MCG/2ML IJ SOLN
INTRAMUSCULAR | Status: DC | PRN
  Administered 2016-09-01: 200 ug via INTRAVENOUS
  Administered 2016-09-01: 100 ug via INTRAVENOUS
  Administered 2016-09-01: 50 ug via INTRAVENOUS

## 2016-09-01 MED ORDER — ROCURONIUM BROMIDE 100 MG/10ML IV SOLN
INTRAVENOUS | Status: DC | PRN
  Administered 2016-09-01 (×4): 20 mg via INTRAVENOUS
  Administered 2016-09-01: 50 mg via INTRAVENOUS

## 2016-09-01 MED ORDER — VANCOMYCIN HCL IN DEXTROSE 1-5 GM/200ML-% IV SOLN: 1000.0000 mg | INTRAVENOUS | Status: DC

## 2016-09-01 MED ORDER — PANTOPRAZOLE SODIUM 40 MG IV SOLR
40.0000 mg | Freq: Every day | INTRAVENOUS | Status: DC
  Administered 2016-09-01: 40 mg via INTRAVENOUS
  Filled 2016-09-01: qty 40

## 2016-09-01 MED ORDER — ACETAMINOPHEN 650 MG RE SUPP: 650.0000 mg | RECTAL | Status: DC | PRN

## 2016-09-01 MED ORDER — FUROSEMIDE 40 MG PO TABS
40.0000 mg | ORAL_TABLET | Freq: Every day | ORAL | Status: DC
  Administered 2016-09-01: 40 mg via ORAL
  Filled 2016-09-01: qty 1

## 2016-09-01 MED ORDER — ATORVASTATIN CALCIUM 40 MG PO TABS
40.0000 mg | ORAL_TABLET | Freq: Every day | ORAL | Status: DC
  Administered 2016-09-01: 40 mg via ORAL
  Filled 2016-09-01: qty 1

## 2016-09-01 MED ORDER — BUPIVACAINE HCL (PF) 0.5 % IJ SOLN
INTRAMUSCULAR | Status: AC
  Filled 2016-09-01: qty 30

## 2016-09-01 MED ORDER — VANCOMYCIN HCL 10 G IV SOLR
1500.0000 mg | Freq: Once | INTRAVENOUS | Status: AC
  Administered 2016-09-01: 1500 mg via INTRAVENOUS
  Filled 2016-09-01: qty 1500

## 2016-09-01 MED ORDER — ALBUTEROL SULFATE (2.5 MG/3ML) 0.083% IN NEBU: 3.0000 mL | INHALATION_SOLUTION | Freq: Four times a day (QID) | RESPIRATORY_TRACT | Status: DC | PRN

## 2016-09-01 MED ORDER — DESVENLAFAXINE SUCCINATE ER 50 MG PO TB24
50.0000 mg | ORAL_TABLET | Freq: Every day | ORAL | Status: DC
  Filled 2016-09-01: qty 1

## 2016-09-01 MED ORDER — ONDANSETRON HCL 4 MG/2ML IJ SOLN
INTRAMUSCULAR | Status: DC | PRN
  Administered 2016-09-01: 4 mg via INTRAVENOUS

## 2016-09-01 MED ORDER — POLYETHYLENE GLYCOL 3350 17 G PO PACK: 17.0000 g | PACK | Freq: Every day | ORAL | Status: DC | PRN

## 2016-09-01 MED ORDER — ONDANSETRON HCL 4 MG/2ML IJ SOLN
4.0000 mg | INTRAMUSCULAR | Status: DC | PRN
Start: 2016-09-01 — End: 2016-09-02

## 2016-09-01 MED ORDER — METHOCARBAMOL 1000 MG/10ML IJ SOLN
500.0000 mg | Freq: Four times a day (QID) | INTRAVENOUS | Status: DC | PRN
  Filled 2016-09-01: qty 5

## 2016-09-01 MED ORDER — LACTATED RINGERS IV SOLN
INTRAVENOUS | Status: DC | PRN
  Administered 2016-09-01 (×2): via INTRAVENOUS

## 2016-09-01 MED ORDER — VENLAFAXINE HCL ER 75 MG PO CP24
75.0000 mg | ORAL_CAPSULE | Freq: Every day | ORAL | Status: DC
  Administered 2016-09-01: 75 mg via ORAL
  Filled 2016-09-01: qty 1

## 2016-09-01 MED ORDER — BUPIVACAINE HCL (PF) 0.25 % IJ SOLN
INTRAMUSCULAR | Status: AC
  Filled 2016-09-01: qty 30

## 2016-09-01 MED ORDER — SUGAMMADEX SODIUM 200 MG/2ML IV SOLN
INTRAVENOUS | Status: AC
  Filled 2016-09-01: qty 2

## 2016-09-01 MED ORDER — LIDOCAINE HCL (CARDIAC) 20 MG/ML IV SOLN
INTRAVENOUS | Status: DC | PRN
  Administered 2016-09-01: 50 mg via INTRAVENOUS

## 2016-09-01 MED ORDER — ALUM & MAG HYDROXIDE-SIMETH 200-200-20 MG/5ML PO SUSP: 30.0000 mL | Freq: Four times a day (QID) | ORAL | Status: DC | PRN

## 2016-09-01 MED ORDER — SUGAMMADEX SODIUM 500 MG/5ML IV SOLN
INTRAVENOUS | Status: DC | PRN
  Administered 2016-09-01: 272.8 mg via INTRAVENOUS

## 2016-09-01 MED ORDER — MENTHOL 3 MG MT LOZG: 1.0000 | LOZENGE | OROMUCOSAL | Status: DC | PRN

## 2016-09-01 MED ORDER — METHOCARBAMOL 500 MG PO TABS: 500.0000 mg | ORAL_TABLET | Freq: Four times a day (QID) | ORAL | Status: DC | PRN

## 2016-09-01 MED ORDER — THROMBIN 20000 UNITS EX KIT
PACK | CUTANEOUS | Status: DC | PRN
  Administered 2016-09-01: 20 mL via TOPICAL

## 2016-09-01 MED ORDER — HYDRALAZINE HCL 20 MG/ML IJ SOLN
10.0000 mg | Freq: Four times a day (QID) | INTRAMUSCULAR | Status: DC | PRN
  Filled 2016-09-01: qty 0.5

## 2016-09-01 MED ORDER — BUPIVACAINE LIPOSOME 1.3 % IJ SUSP
INTRAMUSCULAR | Status: DC | PRN
  Administered 2016-09-01: 20 mL

## 2016-09-01 MED ORDER — EPHEDRINE 5 MG/ML INJ
INTRAVENOUS | Status: AC
  Filled 2016-09-01: qty 10

## 2016-09-01 MED ORDER — POTASSIUM CHLORIDE IN NACL 20-0.9 MEQ/L-% IV SOLN
INTRAVENOUS | Status: DC
  Administered 2016-09-01: 18:00:00 via INTRAVENOUS
  Filled 2016-09-01: qty 1000

## 2016-09-01 MED ORDER — FUROSEMIDE 10 MG/ML IJ SOLN
INTRAMUSCULAR | Status: AC
  Filled 2016-09-01: qty 4

## 2016-09-01 MED ORDER — PROPOFOL 10 MG/ML IV BOLUS
INTRAVENOUS | Status: DC | PRN
  Administered 2016-09-01: 200 mg via INTRAVENOUS

## 2016-09-01 MED ORDER — THROMBIN 20000 UNITS EX SOLR
CUTANEOUS | Status: AC
  Filled 2016-09-01: qty 20000

## 2016-09-01 MED ORDER — SUCCINYLCHOLINE CHLORIDE 20 MG/ML IJ SOLN
INTRAMUSCULAR | Status: DC | PRN
  Administered 2016-09-01: 120 mg via INTRAVENOUS

## 2016-09-01 MED ORDER — BUDESONIDE 0.25 MG/2ML IN SUSP
0.2500 mg | Freq: Two times a day (BID) | RESPIRATORY_TRACT | Status: DC
  Administered 2016-09-01 – 2016-09-02 (×2): 0.25 mg via RESPIRATORY_TRACT
  Filled 2016-09-01 (×2): qty 2

## 2016-09-01 MED ORDER — SODIUM CHLORIDE 0.9% FLUSH
3.0000 mL | Freq: Two times a day (BID) | INTRAVENOUS | Status: DC
  Administered 2016-09-01: 3 mL via INTRAVENOUS

## 2016-09-01 MED ORDER — PROMETHAZINE HCL 25 MG/ML IJ SOLN: 6.2500 mg | INTRAMUSCULAR | Status: DC | PRN

## 2016-09-01 MED ORDER — FLEET ENEMA 7-19 GM/118ML RE ENEM: 1.0000 | ENEMA | Freq: Once | RECTAL | Status: DC | PRN

## 2016-09-01 MED ORDER — MIDAZOLAM HCL 2 MG/2ML IJ SOLN
INTRAMUSCULAR | Status: DC | PRN
Start: 2016-09-01 — End: 2016-09-01
  Administered 2016-09-01: 2 mg via INTRAVENOUS

## 2016-09-01 MED ORDER — HYDROMORPHONE HCL 1 MG/ML IJ SOLN: 0.2500 mg | INTRAMUSCULAR | Status: DC | PRN

## 2016-09-01 MED ORDER — MIDAZOLAM HCL 2 MG/2ML IJ SOLN
INTRAMUSCULAR | Status: AC
  Filled 2016-09-01: qty 2

## 2016-09-01 MED ORDER — SODIUM CHLORIDE 0.9% FLUSH: 3.0000 mL | INTRAVENOUS | Status: DC | PRN

## 2016-09-01 MED ORDER — OXYCODONE-ACETAMINOPHEN 10-325 MG PO TABS: 1.0000 | ORAL_TABLET | ORAL | Status: DC | PRN

## 2016-09-01 MED ORDER — 0.9 % SODIUM CHLORIDE (POUR BTL) OPTIME
TOPICAL | Status: DC | PRN
  Administered 2016-09-01: 1000 mL

## 2016-09-01 MED ORDER — FENTANYL CITRATE (PF) 100 MCG/2ML IJ SOLN
INTRAMUSCULAR | Status: AC
  Filled 2016-09-01: qty 2

## 2016-09-01 MED ORDER — HYDROCHLOROTHIAZIDE 12.5 MG PO CAPS: 12.5000 mg | ORAL_CAPSULE | Freq: Every day | ORAL | Status: DC

## 2016-09-01 MED ORDER — BISACODYL 5 MG PO TBEC: 5.0000 mg | DELAYED_RELEASE_TABLET | Freq: Every day | ORAL | Status: DC | PRN

## 2016-09-01 MED ORDER — SODIUM CHLORIDE 0.9 % IV SOLN: 250.0000 mL | INTRAVENOUS | Status: DC

## 2016-09-01 MED ORDER — HEMOSTATIC AGENTS (NO CHARGE) OPTIME
TOPICAL | Status: DC | PRN
  Administered 2016-09-01: 1 via TOPICAL

## 2016-09-01 MED ORDER — LISINOPRIL 20 MG PO TABS
20.0000 mg | ORAL_TABLET | Freq: Every day | ORAL | Status: DC
  Administered 2016-09-02: 20 mg via ORAL
  Filled 2016-09-01: qty 1

## 2016-09-01 MED ORDER — HYDRALAZINE HCL 20 MG/ML IJ SOLN: 10.0000 mg | Freq: Four times a day (QID) | INTRAMUSCULAR | Status: DC | PRN

## 2016-09-01 MED ORDER — DOCUSATE SODIUM 100 MG PO CAPS
100.0000 mg | ORAL_CAPSULE | Freq: Two times a day (BID) | ORAL | Status: DC
  Administered 2016-09-01 – 2016-09-02 (×2): 100 mg via ORAL
  Filled 2016-09-01 (×2): qty 1

## 2016-09-01 MED ORDER — TEMAZEPAM 15 MG PO CAPS: 30.0000 mg | ORAL_CAPSULE | Freq: Every evening | ORAL | Status: DC | PRN

## 2016-09-01 MED ORDER — FUROSEMIDE 10 MG/ML IJ SOLN: 40.0000 mg | Freq: Two times a day (BID) | INTRAMUSCULAR | Status: DC

## 2016-09-01 MED ORDER — ACETAMINOPHEN 325 MG PO TABS: 650.0000 mg | ORAL_TABLET | ORAL | Status: DC | PRN

## 2016-09-01 MED ORDER — EZETIMIBE 10 MG PO TABS
10.0000 mg | ORAL_TABLET | Freq: Every day | ORAL | Status: DC
  Administered 2016-09-01 – 2016-09-02 (×2): 10 mg via ORAL
  Filled 2016-09-01 (×2): qty 1

## 2016-09-01 MED ORDER — BUPIVACAINE HCL 0.5 % IJ SOLN
INTRAMUSCULAR | Status: DC | PRN
  Administered 2016-09-01: 20 mL

## 2016-09-01 MED ORDER — HYDROCODONE-ACETAMINOPHEN 5-325 MG PO TABS: 1.0000 | ORAL_TABLET | ORAL | Status: DC | PRN

## 2016-09-01 MED ORDER — CHLORHEXIDINE GLUCONATE 4 % EX LIQD: 60.0000 mL | Freq: Once | CUTANEOUS | Status: DC

## 2016-09-01 MED ORDER — FUROSEMIDE 10 MG/ML IJ SOLN
10.0000 mg | Freq: Once | INTRAMUSCULAR | Status: AC
  Administered 2016-09-01: 10 mg via INTRAVENOUS

## 2016-09-01 MED ORDER — ALPRAZOLAM 1 MG PO TABS
1.0000 mg | ORAL_TABLET | Freq: Three times a day (TID) | ORAL | Status: DC | PRN
  Filled 2016-09-01: qty 2

## 2016-09-01 MED ORDER — PHENOL 1.4 % MT LIQD: 1.0000 | OROMUCOSAL | Status: DC | PRN

## 2016-09-01 MED ORDER — LACTATED RINGERS IV SOLN
INTRAVENOUS | Status: DC
  Administered 2016-09-01: 09:00:00 via INTRAVENOUS

## 2016-09-01 MED ORDER — BUPIVACAINE HCL (PF) 0.25 % IJ SOLN
INTRAMUSCULAR | Status: DC | PRN
  Administered 2016-09-01: 30 mL

## 2016-09-01 MED ORDER — ESCITALOPRAM OXALATE 20 MG PO TABS
20.0000 mg | ORAL_TABLET | Freq: Every day | ORAL | Status: DC
  Administered 2016-09-02: 20 mg via ORAL
  Filled 2016-09-01: qty 1

## 2016-09-01 SURGICAL SUPPLY — 76 items
BANDAGE ELASTIC 3 VELCRO ST LF (GAUZE/BANDAGES/DRESSINGS) IMPLANT
BENZOIN TINCTURE PRP APPL 2/3 (GAUZE/BANDAGES/DRESSINGS) ×4 IMPLANT
BNDG ESMARK 4X9 LF (GAUZE/BANDAGES/DRESSINGS) IMPLANT
BNDG GAUZE ELAST 4 BULKY (GAUZE/BANDAGES/DRESSINGS) IMPLANT
BUR ROUND FLUTED 4 SOFT TCH (BURR) ×3 IMPLANT
BUR ROUND FLUTED 4MM SOFT TCH (BURR) ×1
BUR SABER RD CUTTING 3.0 (BURR) ×3 IMPLANT
BUR SABER RD CUTTING 3.0MM (BURR) ×1
CANISTER SUCTION 2500CC (MISCELLANEOUS) ×4 IMPLANT
CLOSURE STERI-STRIP 1/2X4 (GAUZE/BANDAGES/DRESSINGS) ×1
CLSR STERI-STRIP ANTIMIC 1/2X4 (GAUZE/BANDAGES/DRESSINGS) ×3 IMPLANT
COVER MAYO STAND STRL (DRAPES) ×4 IMPLANT
COVER SURGICAL LIGHT HANDLE (MISCELLANEOUS) ×4 IMPLANT
CUFF TOURNIQUET SINGLE 18IN (TOURNIQUET CUFF) IMPLANT
CUFF TOURNIQUET SINGLE 24IN (TOURNIQUET CUFF) IMPLANT
DECANTER SPIKE VIAL GLASS SM (MISCELLANEOUS) ×8 IMPLANT
DERMABOND ADVANCED (GAUZE/BANDAGES/DRESSINGS) ×2
DERMABOND ADVANCED .7 DNX12 (GAUZE/BANDAGES/DRESSINGS) ×2 IMPLANT
DRAPE C-ARM 42X72 X-RAY (DRAPES) IMPLANT
DRAPE MICROSCOPE LEICA (MISCELLANEOUS) ×4 IMPLANT
DRAPE PROXIMA HALF (DRAPES) IMPLANT
DRAPE SURG 17X23 STRL (DRAPES) ×16 IMPLANT
DRAPE U-SHAPE 47X51 STRL (DRAPES) ×4 IMPLANT
DRSG EMULSION OIL 3X3 NADH (GAUZE/BANDAGES/DRESSINGS) IMPLANT
DRSG MEPILEX BORDER 4X4 (GAUZE/BANDAGES/DRESSINGS) IMPLANT
DRSG MEPILEX BORDER 4X8 (GAUZE/BANDAGES/DRESSINGS) ×4 IMPLANT
DURAPREP 26ML APPLICATOR (WOUND CARE) ×4 IMPLANT
DURASEAL APPLICATOR TIP (TIP) ×4 IMPLANT
DURASEAL SPINE SEALANT 3ML (MISCELLANEOUS) ×4 IMPLANT
ELECT BLADE 4.0 EZ CLEAN MEGAD (MISCELLANEOUS) ×4
ELECT CAUTERY BLADE 6.4 (BLADE) ×4 IMPLANT
ELECT REM PT RETURN 9FT ADLT (ELECTROSURGICAL) ×4
ELECTRODE BLDE 4.0 EZ CLN MEGD (MISCELLANEOUS) ×2 IMPLANT
ELECTRODE REM PT RTRN 9FT ADLT (ELECTROSURGICAL) ×2 IMPLANT
GAUZE SPONGE 4X4 12PLY STRL (GAUZE/BANDAGES/DRESSINGS) IMPLANT
GLOVE BIOGEL PI IND STRL 8 (GLOVE) ×2 IMPLANT
GLOVE BIOGEL PI INDICATOR 8 (GLOVE) ×2
GLOVE ECLIPSE 8.5 STRL (GLOVE) ×4 IMPLANT
GLOVE ECLIPSE 9.0 STRL (GLOVE) ×4 IMPLANT
GLOVE ORTHO TXT STRL SZ7.5 (GLOVE) ×4 IMPLANT
GLOVE SURG 8.5 LATEX PF (GLOVE) ×4 IMPLANT
GOWN STRL REUS W/ TWL LRG LVL3 (GOWN DISPOSABLE) ×2 IMPLANT
GOWN STRL REUS W/TWL 2XL LVL3 (GOWN DISPOSABLE) ×8 IMPLANT
GOWN STRL REUS W/TWL LRG LVL3 (GOWN DISPOSABLE) ×2
KIT BASIN OR (CUSTOM PROCEDURE TRAY) ×4 IMPLANT
KIT ROOM TURNOVER OR (KITS) ×4 IMPLANT
NEEDLE HYPO 25GX1X1/2 BEV (NEEDLE) ×4 IMPLANT
NEEDLE SPNL 18GX3.5 QUINCKE PK (NEEDLE) ×8 IMPLANT
NS IRRIG 1000ML POUR BTL (IV SOLUTION) ×4 IMPLANT
PACK LAMINECTOMY ORTHO (CUSTOM PROCEDURE TRAY) ×4 IMPLANT
PACK ORTHO EXTREMITY (CUSTOM PROCEDURE TRAY) IMPLANT
PAD ARMBOARD 7.5X6 YLW CONV (MISCELLANEOUS) ×8 IMPLANT
PAD CAST 4YDX4 CTTN HI CHSV (CAST SUPPLIES) IMPLANT
PADDING CAST COTTON 4X4 STRL (CAST SUPPLIES)
PATTIES SURGICAL .5 X.5 (GAUZE/BANDAGES/DRESSINGS) IMPLANT
PATTIES SURGICAL .75X.75 (GAUZE/BANDAGES/DRESSINGS) ×4 IMPLANT
SPONGE LAP 4X18 X RAY DECT (DISPOSABLE) ×8 IMPLANT
SPONGE SURGIFOAM ABS GEL 100 (HEMOSTASIS) ×4 IMPLANT
SUT ETHILON 4 0 PS 2 18 (SUTURE) IMPLANT
SUT NURALON 4 0 TR CR/8 (SUTURE) ×4 IMPLANT
SUT VIC AB 1 CTX 36 (SUTURE) ×2
SUT VIC AB 1 CTX36XBRD ANBCTR (SUTURE) ×2 IMPLANT
SUT VIC AB 2-0 CT1 27 (SUTURE) ×2
SUT VIC AB 2-0 CT1 TAPERPNT 27 (SUTURE) ×2 IMPLANT
SUT VIC AB 3-0 X1 27 (SUTURE) ×4 IMPLANT
SUT VICRYL 0 UR6 27IN ABS (SUTURE) ×4 IMPLANT
SYR 20CC LL (SYRINGE) ×4 IMPLANT
SYR CONTROL 10ML LL (SYRINGE) ×4 IMPLANT
TOWEL OR 17X24 6PK STRL BLUE (TOWEL DISPOSABLE) ×4 IMPLANT
TOWEL OR 17X26 10 PK STRL BLUE (TOWEL DISPOSABLE) ×4 IMPLANT
TRAY FOLEY BAG SILVER LF 16FR (CATHETERS) ×4 IMPLANT
TRAY FOLEY CATH 16FRSI W/METER (SET/KITS/TRAYS/PACK) IMPLANT
TUBE CONNECTING 12'X1/4 (SUCTIONS) ×1
TUBE CONNECTING 12X1/4 (SUCTIONS) ×3 IMPLANT
UNDERPAD 30X30 (UNDERPADS AND DIAPERS) ×4 IMPLANT
WATER STERILE IRR 1000ML POUR (IV SOLUTION) IMPLANT

## 2016-09-01 NOTE — Interval H&P Note (Signed)
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 After consideration of risks, benefits and other options for treatment, the patient has consented to  Procedure(s): BILATERAL PARTIAL HEMILAMINECTOMY L4-5 (N/A) LEFT OPEN CARPAL TUNNEL RELEASE (Left) as a surgical intervention .  The patient's history has been reviewed, patient examined, no change in status, stable for surgery.  I have reviewed the patient's chart and labs.  Questions were answered to the patient's satisfaction.     [name]

## 2016-09-01 NOTE — Progress Notes (Signed)
Anesthesiology Follow-up:  45 year old male who underwent bilateral partial hemi-laminectomy [id number] today complicated by post-op hypoxemia and pulmonary edema. Treated with BIPAP and diuresis in PACU. Now on 3S13 resting comfortably with unlabored respirations  on  2L O2 Sat 98%. Doing well at present.  Roberts Gaudy

## 2016-09-01 NOTE — Anesthesia Preprocedure Evaluation (Addendum)
Anesthesia Evaluation  Patient identified by MRN, date of birth, ID band  Reviewed: Allergy & Precautions, NPO status , Patient's Chart, lab work & pertinent test results  Airway Mallampati: III  TM Distance: >3 FB Neck ROM: Full   Comment: Thick fat neck Dental  (+) Teeth Intact   Pulmonary shortness of breath, asthma , Current Smoker,    breath sounds clear to auscultation       Cardiovascular hypertension, + pacemaker  Rhythm:Regular Rate:Normal     Neuro/Psych    GI/Hepatic   Endo/Other  Hypothyroidism Morbid obesity  Renal/GU      Musculoskeletal   Abdominal (+) + obese,   Peds  Hematology   Anesthesia Other Findings   Reproductive/Obstetrics                          Anesthesia Physical Anesthesia Plan  ASA: III  Anesthesia Plan: General   Post-op Pain Management:    Induction: Intravenous  Airway Management Planned: Oral ETT and Video Laryngoscope Planned  Additional Equipment:   Intra-op Plan:   Post-operative Plan: Extubation in OR  Informed Consent: I have reviewed the patients History and Physical, chart, labs and discussed the procedure including the risks, benefits and alternatives for the proposed anesthesia with the patient or authorized representative who has indicated his/her understanding and acceptance.   Dental advisory given  Plan Discussed with:   Anesthesia Plan Comments:        Anesthesia Quick Evaluation

## 2016-09-01 NOTE — Discharge Instructions (Addendum)
° ° °  No lifting greater than 5 lbs. Avoid bending, stooping and twisting. Walk in house for first week them may start to get out slowly increasing distance up to one quarter mile by 3 weeks post op. Keep incision dry for 3 days, may use tegaderm or similar water impervious dressing.

## 2016-09-01 NOTE — Op Note (Signed)
09/01/2016  12:20 PM  PATIENT:  Nathan Aguilar  45 y.o. 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 Orene Desanctis.    COMPLICATIONS: 2-6ST Dural Tear left L4-5 dorsolateral below takeoff of L5 and posterior location, neural elements intact and repair of tear with 3 x 4-0 Neurolon Sutures and Duraseal. No leak at 30 mm Hg valsalva.  EBL: 200CC     PROCEDURE:The patient was met in the holding area, and the appropriate Right Lumbar level L4-5 identified and marked with "x" and my initials.The patient was then transported to OR and was placed under general anesthesia without difficulty. The patient received appropriate vancomycin preoperative antibiotic prophylaxis for a penicillin allergy.The patient after intubation atraumatically was transferred to the operating room table, prone position, Wilson frame, sliding OR table. All pressure points were well padded. The arms in 90-90 well-padded at the elbows. Standard prep with DuraPrep solution lower dorsal spine to the mid sacral segment. Draped in the usual manner iodine Vi-Drape was used. Time-out procedure was called and correct. 2x 18-gauge spinal needle was then inserted at the expected L4 level. C-arm was draped sterilely to the field and used to identify the spinal needles positions. The upper needle was at the lower aspect of the lamina of L4 Skin superior to this was then infiltrated with marcaine 0.5% 1:1 exparel 1.3% total 30cc. An approximately an inch and  a half in length was then made through skin and subcutaneous layers in the expected midline just superior to the spinal needle entry point. An incision made into the right and left lumbosacral fascia approximately an inch in length .  Cobb elevator was then introduced into the incision site and used to carefully perform subperiosteal dissection of the paralumbar muscles off of the posterior lamina of the expected L4-5 level. The depth measured at about 70 mm and 70 mm retractors and placed on the scaffolding for the Boss McCollough retractor and guided down to and docking on the posterior aspect of the lamina at the expected L4-5 level. Lateral radiograph was used to identify a Penfield #4 at the appropriate level L4-5. The operating room microscope sterilely draped brought into the field. Under the operating room microscope, the left L4-5 interspace carefully debrided bilaterally the small amount of muscle attachment here and high-speed bur used to drill the medial aspect of the inferior bilateral articular process of L4 approximately 10%. A localization radiograph view was obtained with Penfield 4 in the L4-5 facet. 2 mm Kerrison then used to enter the spinal canal over the superior aspect of the L5 lamina carefully using the Kerrison to debris the attachment as a curet. Foraminotomy was then performed over the L5 nerve root. The medial 10% superior articular process of L5 and then resected using an 41m and 2 mm Kerrison.  Dural tear occurred at this point at the left superior medial L4-5 facet margin, the ligamentum flavum adherent to the thecal sac, so that a small rent occurred on the left side posterior and distal to the takeoff of the L5  nerve root. A cottonoid used to protect the neural elements and a single stay 4-0 neurolon suture placed caudal to the tear for traction and then 2 interrupted sutures placed superior or cranial to the stay suture the neural elements were carefully reduced with a Penfield #4  prior to repair and additional suture then placed over the caudal aspect of the tear after dividing the sutures. Valsalva to 30 mm Hg showed no sign of CSF leak. Penfield 4 was then used to carefully mobilize the thecal sac medially and the L5 nerve root identified within the lateral recess flattened beneath the medial facet.. Carefully the lateral aspect of the L5 nerve root was identified and a Penfield 4 was used to mobilize the nerve medially such that the lateral recess was visible with microscope. Using a Penfield 4 for retraction and then a Derricho retractor foraminotomies was performed over the L4 nerve root the nerve root was noted to be decompressed. The nerve root able to be retracted along the medial aspect of the L5 pedicle and the overlying hypertrophic ligmentum flavum resected. Ligamentum flavum was further debrided superiorly to the level L4-5 disc. Had a moderate amount of further resection of the L5 lamina inferiorly was performed. With this then the disc space at L4-5 was easily visualized. Ligamentum flavum was debrided and lateral recess along the medial aspect L4-5 facet no further decompression was necessary. Ball tip nerve probe was then able to carefully palpate the neuroforamen for L4 and L5 finding these to be well decompressed. Bone wax applied to the bleeding cancellous bone surfaces. Then after adequate hemostasis and no active bleeding noted duraseal was applied to the left L4-5 thecal sac.  Attention then turned to the right L4-5 level which was easily visualized with the microscope. Soft tissues debrided about the posterior aspect of the L4-5 interspace. High-speed bur and then used to carefully drill inferior 3 or 4 mm of the right side L4 lamina and on the medial aspect of the right L4 inferior articular process of 3 mm. The superior margin of the L5 lamina then carefully debrided with curette and a 2 mm kerrison then used to enter the spinal canal over the superior aspect of  the L4 lamina resecting bone over the superior aspect and freeing up the attachment of ligamentum flavum here. Ligamentum flavum then debrided with the 2 mm and 3 mm Kerrisons we decompressed the L5 nerve root and the lateral recess right L4-5 decompressed using 2 and 3 mm Kerrisons sizing hypertrophic reflected ligamentum flavum extending superiorly. From was resected off the ventral aspect of the inferior margin of the L4 lamina. Hockey-stick nerve probe could then be passed out the L4 neuroforamen and the L5 neuroforamen. Venous bleeding encountered. Thrombin-soaked Gelfoam used to control this following this then the sac and the L5 nerve root were mobilized medially and the right L4-5 disc examined and found not to be herniated. Irrigation was carried out down to this bleeding controlled with Gelfoam. Gelfoam was then removed. Irrigation carried careful examination demonstrated no active bleeding present. Retractors were then carefully removed Since carefully then the Bleeding was then controlled using thrombin-soaked Gelfoam small cottonoids. Small amount of bleeding within the soft tissue mass the laminotomy area was controlled using bipolar electrocautery. Irrigation was carried out using copious amounts of irrigant solution. All Gelfoam were then removed. No significant active bleeding present at the time of removal. All instruments sponge counts were correct traction system was then carefully removed and only bipolar electrocautery of  any small bleeders. Lumbodorsal fascia was then carefully approximated with interrupted 0 Vicryl sutures, UR 6 needle deep subcutaneous layers were approximated with interrupted 0 Vicryl sutures on UR 6 the appear subcutaneous layers approximated with interrupted 2-0 Vicryl sutures and the skin closed with a running subcutaneous stitch of 4-0 Vicryl. Dermabond was applied allowed to dry and then Mepilex bandage applied. Patient was then carefully returned to supine position on  a stretcher,a foley catheter was placed sterilely, the patient then reactivated and extubated. He was then returned to recovery room in satisfactory condition.   Benjiman Core, PA-C perform the duties of assistant surgeon during this case. he was present from the beginning of the case to the end of the case assisting in transfer the patient from his stretcher to the OR table and back to the stretcher at the end of the case. Assisted in careful retraction and suction of the laminectomy site delicate neural structures operating under the operating room microscope. He performed closure of the incision from the fascia to the skin applying the dressing.            [name]  09/01/2016, 12:20 PM

## 2016-09-01 NOTE — H&P (Signed)
Nathan Aguilar is an 45 y.o. male.   Chief Complaint: low back and leg pain, left hand pain numbness/tingling HPI: patient with hx of L4-5 stenosis and left carpal tunnel syndrome presents with the above complaint.  Failed conservative treatment.  Progressively worsening symptoms.   Past Medical History:  Diagnosis Date  . Anxiety   . Asthma   . Cancer ([place])    skin  . Depression   . Dyspnea   . Hypertension   . Hypothyroidism   . Seizures ([place])    as child    Past Surgical History:  Procedure Laterality Date  . ANTERIOR CERVICAL DECOMP/DISCECTOMY FUSION  10/23/2011   Procedure: ANTERIOR CERVICAL DECOMPRESSION/DISCECTOMY FUSION 1 LEVEL;  Surgeon: [name], MD;  Location: [facility];  Service: Orthopedics;  Laterality: N/A;  Anterior cervical discectomy fusion C6-7 with local bone graft, plate, screws, Allograft bone graft  . BACK SURGERY    . CARPAL TUNNEL RELEASE     right  . CARPAL TUNNEL RELEASE  10/23/2011   Procedure: CARPAL TUNNEL RELEASE;  Surgeon: [name], MD;  Location: [facility];  Service: Orthopedics;  Laterality: Left;  left carpal tunnel release  . PATELLA RECONSTRUCTION     arthroscopic bilateral knee cap    Family History  Problem Relation Age of Onset  . Leukemia Mother   . Heart disease Father    Social History:  reports that he has been smoking Cigarettes.  He has a 25.00 pack-year smoking history. He has never used smokeless tobacco. He reports that he does not drink alcohol or use drugs.  Allergies:  Allergies  Allergen Reactions  . Penicillins Hives, Nausea And Vomiting and Other (See Comments)     Has patient had a PCN reaction causing immediate rash, facial/tongue/throat swelling, SOB or lightheadedness with hypotension:   # # YES # #  Has patient had a PCN reaction causing severe rash involving mucus membranes or skin necrosis:  # # YES # #  Has patient had a PCN reaction that REQUIRED HOSPITALIZATION    # # YES # # Has patient had a PCN  reaction occurring within the last 10 years: No If all of the above answers are "NO", then may proceed with Cephalosporin use.     No prescriptions prior to admission.    Results for orders placed or performed during the hospital encounter of 08/31/16 (from the past 48 hour(s))  CBC     Status: Abnormal   Collection Time: 08/31/16  1:19 PM  Result Value Ref Range   WBC 12.1 (H) 4.0 - 10.5 K/uL   RBC 4.89 4.22 - 5.81 MIL/uL   Hemoglobin 14.5 13.0 - 17.0 g/dL   HCT 43.8 39.0 - 52.0 %   MCV 89.6 78.0 - 100.0 fL   MCH 29.7 26.0 - 34.0 pg   MCHC 33.1 30.0 - 36.0 g/dL   RDW 14.5 11.5 - 15.5 %   Platelets 261 150 - 400 K/uL  Basic metabolic panel     Status: Abnormal   Collection Time: 08/31/16  1:19 PM  Result Value Ref Range   Sodium 136 135 - 145 mmol/L   Potassium 3.7 3.5 - 5.1 mmol/L   Chloride 101 101 - 111 mmol/L   CO2 25 22 - 32 mmol/L   Glucose, Bld 173 (H) 65 - 99 mg/dL   BUN 11 6 - 20 mg/dL   Creatinine, Ser 0.95 0.61 - 1.24 mg/dL   Calcium 9.3 8.9 - 10.3 mg/dL  GFR calc non Af Amer >60 >60 mL/min   GFR calc Af Amer >60 >60 mL/min    Comment: (NOTE) The eGFR has been calculated using the CKD EPI equation. This calculation has not been validated in all clinical situations. eGFR's persistently <60 mL/min signify possible Chronic Kidney Disease.    Anion gap 10 5 - 15  Surgical pcr screen     Status: None   Collection Time: 08/31/16  2:06 PM  Result Value Ref Range   MRSA, PCR NEGATIVE NEGATIVE   Staphylococcus aureus NEGATIVE NEGATIVE    Comment:        The Xpert SA Assay (FDA approved for NASAL specimens in patients over 21 years of age), is one component of a comprehensive surveillance program.  Test performance has been validated by [facility] for patients greater than or equal to 59 year old. It is not intended to diagnose infection nor to guide or monitor treatment.    No results found.  Review of Systems  Constitutional: Negative.   HENT:  Negative.   Eyes: Negative.   Respiratory: Negative.   Cardiovascular: Negative.   Gastrointestinal: Negative.   Genitourinary: Negative.   Musculoskeletal: Positive for back pain.  Neurological: Positive for tingling and focal weakness.  Psychiatric/Behavioral: Negative.     There were no vitals taken for this visit. Physical Exam  Constitutional: He is oriented to person, place, and time. He appears well-developed. No distress.  HENT:  Head: Normocephalic and atraumatic.  Eyes: EOM are normal. Pupils are equal, round, and reactive to light.  Neck: Normal range of motion.  Respiratory: No respiratory distress.  GI: He exhibits no distension.  Musculoskeletal:  Left wrist positive tinels and phalens.  Lumbar paraspinal tenderness.    Neurological: He is alert and oriented to person, place, and time.  Skin: Skin is warm and dry.  Psychiatric: He has a normal mood and affect.     Assessment/Plan L4-5 stenosis and left carpal tunnel syndrome  Will proceed with BILATERAL PARTIAL HEMILAMINECTOMY L4-5 and LEFT OPEN CARPAL TUNNEL RELEASE as scheduled.  Surgical procedure along with possible risks and complications discussed.  All questions answered and wishes to proceed.      Benjiman Core, PA-C 09/01/2016, 1:00 AM  Patient examined and lab reviewed with Ricard Dillon, PA-C.

## 2016-09-01 NOTE — Anesthesia Procedure Notes (Signed)
Procedure Name: Intubation Date/Time: 09/01/2016 9:33 AM Performed by: Eligha Bridegroom Pre-anesthesia Checklist: Patient identified, Emergency Drugs available, Suction available, Patient being monitored and Timeout performed Patient Re-evaluated:Patient Re-evaluated prior to inductionOxygen Delivery Method: Circle system utilized Preoxygenation: Pre-oxygenation with 100% oxygen Intubation Type: IV induction and Rapid sequence Ventilation: Mask ventilation without difficulty Laryngoscope Size: Glidescope and 4 Grade View: Grade III Tube type: Oral Tube size: 8.0 mm Number of attempts: 1 Placement Confirmation: ETT inserted through vocal cords under direct vision,  positive ETCO2 and breath sounds checked- equal and bilateral Secured at: 24 cm Tube secured with: Tape Dental Injury: Teeth and Oropharynx as per pre-operative assessment

## 2016-09-01 NOTE — Interval H&P Note (Signed)
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 After consideration of risks, benefits and other options for treatment, the patient has consented to  Procedure(s): BILATERAL PARTIAL HEMILAMINECTOMY L4-5 (N/A) LEFT OPEN CARPAL TUNNEL RELEASE (Left) as a surgical intervention .  The patient's history has been reviewed, patient examined, no change in status, stable for surgery.  I have reviewed the patient's chart and labs.  Questions were answered to the patient's satisfaction.   After discussion with [name] the left wrist surgery would require exploration of a previous open carpal tunnel site. I recommend that this surgery be cancelled in favor of a hand specialist evaluating and treating as he has had a previous surgical carpal tunnel release performed with his cervical spine surgery in the past.   [name]

## 2016-09-01 NOTE — Transfer of Care (Signed)
Immediate Anesthesia Transfer of Care Note  Patient: Nathan Aguilar  Procedure(s) Performed: Procedure(s): BILATERAL PARTIAL HEMILAMINECTOMY L4-5 (Bilateral)  Patient Location: PACU  Anesthesia Type:General  Level of Consciousness: awake, alert  and oriented  Airway & Oxygen Therapy: Patient Spontanous Breathing and Patient connected to face mask oxygen  Post-op Assessment: Report given to RN and Post -op Vital signs reviewed and stable  Post vital signs: Reviewed and stable  Last Vitals:  Vitals:   09/01/16 0808  BP: (!) 151/78  Pulse: 99  Resp: 18  Temp: 36.7 C    Last Pain:  Vitals:   09/01/16 0823  TempSrc:   PainSc: 8          Complications: No apparent anesthesia complications

## 2016-09-01 NOTE — Progress Notes (Addendum)
CRITICAL VALUE ALERT  Critical value received:  Troponin 0.03   Date of notification:  09/01/2016  Time of notification:  2229  Critical value read back:Yes.    Nurse who received alert:  Fara Chute   MD notified (1st page):  Lorin Mercy (on call)   Time of first page:  2233  MD notified (2nd page):  Time of second page:  Responding MD:     Time MD responded:

## 2016-09-01 NOTE — Progress Notes (Signed)
Patient states that he does not want to wear the BIPAP, machine in the room. Patient resting comfortably on 3 LPM nasal cannula. SPO2 98%.

## 2016-09-01 NOTE — Progress Notes (Signed)
Dr Orene Desanctis aware/present re: o2  Use/resp staus, with sats  </= 85 with nasal airways/ nrbm.Marland KitchenMarland KitchenPlan   now for CPAP

## 2016-09-01 NOTE — Consult Note (Signed)
Consultation Note   [name] [id number] DOB: Apr 29, 1971 DOA: 09/01/2016   PCP: Nathan Junior, MD   Patient coming from/Resides with: Private residence  Requesting physician: Dr. Rubbie Aguilar  Reason for consultation: Postoperative hypoxemia  HPI: Nathan Aguilar is a 45 y.o. male with medical history significant for obesity, hypertension, hypothyroidism and childhood seizures. Patient with ongoing low back and leg pain as well as left hand pain numbness and tingling failed conservative management of known L4-5 stenosis and underwent L4-5 bilateral partial hemilaminectomy with a dural leak. In the post operative setting in recovery patient was found to be hypoxemic with altered mentation and was placed empirically on CPAP. Chest x-ray revealed mild edema. Patient found with bilateral lower extremity edema as well.   Review of Systems:  *Unable to complete a full review of systems given patient's sedation and altered mentation in the recovery room. I was able to ascertain that although he does not have a prior history of sleep apnea he was told that he might consider formal evaluation for this disorder.   Past Medical History:  Diagnosis Date  . Anxiety   . Asthma   . Cancer ([place])    skin  . Depression   . Dyspnea   . Hypertension   . Hypothyroidism   . Seizures ([place])    as child    Past Surgical History:  Procedure Laterality Date  . ANTERIOR CERVICAL DECOMP/DISCECTOMY FUSION  10/23/2011   Procedure: ANTERIOR CERVICAL DECOMPRESSION/DISCECTOMY FUSION 1 LEVEL;  Surgeon: [name], MD;  Location: [facility];  Service: Orthopedics;  Laterality: N/A;  Anterior cervical discectomy fusion C6-7 with local bone graft, plate, screws, Allograft bone graft  . BACK SURGERY    . CARPAL TUNNEL RELEASE     right  . CARPAL TUNNEL RELEASE  10/23/2011   Procedure: CARPAL TUNNEL RELEASE;  Surgeon: [name], MD;  Location: [facility];  Service: Orthopedics;  Laterality: Left;   left carpal tunnel release  . PATELLA RECONSTRUCTION     arthroscopic bilateral knee cap    Social History   Social History  . Marital status: Single    Spouse name: N/A  . Number of children: N/A  . Years of education: N/A   Occupational History  . Not on file.   Social History Main Topics  . Smoking status: Current Every Day Smoker    Packs/day: 1.00    Years: 25.00    Types: Cigarettes  . Smokeless tobacco: Never Used     Comment: 1/2 TO 1 PACK PER DAY  . Alcohol use No  . Drug use: No  . Sexual activity: Not on file   Other Topics Concern  . 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 Penicillins Hives, Nausea And Vomiting and Other (See Comments)     Has patient had a PCN reaction causing immediate rash, facial/tongue/throat swelling, SOB or lightheadedness with hypotension:   # # YES # #  Has patient had a PCN reaction causing severe rash involving mucus membranes or skin necrosis:  # # YES # #  Has patient had a PCN reaction that REQUIRED HOSPITALIZATION    # # YES # # Has patient had a PCN reaction occurring within the  last 10 years: No If all of the above answers are "NO", then may proceed with Cephalosporin use.     Family History  Problem Relation Age of Onset  . Leukemia Mother   . Heart disease Father      Prior to Admission medications   Medication Sig Start Date End Date Taking? Authorizing Provider  albuterol (PROAIR HFA) 108 (90 Base) MCG/ACT inhaler Inhale 1 puff into the lungs every 6 (six) hours as needed for wheezing or shortness of breath.    Yes Historical Provider, MD  ALPRAZolam Duanne Moron) 1 MG tablet Take 1 mg by mouth 3 (three) times daily as needed for anxiety.   Yes Historical Provider, MD  aspirin EC 81 MG tablet Take 81 mg by mouth daily.   Yes Historical Provider, MD   atorvastatin (LIPITOR) 40 MG tablet Take 40 mg by mouth daily.   Yes Historical Provider, MD  Cholecalciferol (VITAMIN D) 2000 units CAPS Take 2,000 Units by mouth daily.    Yes Historical Provider, MD  desvenlafaxine (PRISTIQ) 50 MG 24 hr tablet Take 50 mg by mouth daily.     Yes Historical Provider, MD  escitalopram (LEXAPRO) 20 MG tablet Take 20 mg by mouth daily.   Yes Historical Provider, MD  ezetimibe (ZETIA) 10 MG tablet Take 10 mg by mouth daily.   Yes Historical Provider, MD  Fluticasone Furoate (ARNUITY ELLIPTA) 100 MCG/ACT AEPB Inhale 1 puff into the lungs daily.   Yes Historical Provider, MD  furosemide (LASIX) 40 MG tablet Take 40 mg by mouth.   Yes Historical Provider, MD  levothyroxine (SYNTHROID, LEVOTHROID) 75 MCG tablet Take 75 mcg by mouth daily before breakfast.   Yes Historical Provider, MD  lisinopril-hydrochlorothiazide (PRINZIDE,ZESTORETIC) 20-12.5 MG tablet Take 1 tablet by mouth daily.   Yes Historical Provider, MD  oxyCODONE-acetaminophen (PERCOCET) 10-325 MG tablet Take 1 tablet by mouth every 4 (four) hours as needed for pain. 08/07/16  Yes Historical Provider, MD  temazepam (RESTORIL) 30 MG capsule Take 30 mg by mouth at bedtime as needed for sleep.   Yes Historical Provider, MD  venlafaxine XR (EFFEXOR-XR) 75 MG 24 hr capsule Take 75 mg by mouth daily with breakfast.   Yes Historical Provider, MD  diclofenac sodium (VOLTAREN) 1 % GEL Apply 2-4 g topically 4 (four) times daily. 08/26/16   Cherylann Ratel, PA-C    Physical Exam: Vitals:   09/01/16 1345 09/01/16 1357 09/01/16 1412 09/01/16 1427  BP:  (!) 152/88 (!) 144/76 (!) 142/70  Pulse: (!) 104 100 97 98  Resp: (!) 29 (!) 22 18 (!) 32  Temp:      TempSrc:      SpO2: (!) 89% 90% (!) 88% 91%  Weight:      Height:          Constitutional: NAD, calm and sedated, appears to be comfortable Eyes: PERRL, lids and conjunctivae normal ENMT: Mucous membranes are moist. Unable to completely evaluate given  placement of CPAP mask Neck: normal, supple and obese, no masses, no thyromegaly Respiratory: Coarse to auscultation bilaterally. Normal respiratory effort. No accessory muscle use. CPAP in place Cardiovascular: Regular rate and rhythm, no murmurs / rubs / gallops. Trace to 1+ bilateral lower extremity edema. 2+ pedal pulses. No carotid bruits.  Abdomen: no tenderness, no masses palpated. No hepatosplenomegaly. Bowel sounds positive.  Musculoskeletal: no clubbing / cyanosis. No joint deformity upper and lower extremities. Good ROM, no contractures. Normal muscle tone.  Skin: no rashes, lesions, ulcers. No induration  Neurologic: CN 2-12 grossly intact. Sensation intact, DTR normal. Strength 5/5 x all 4 extremities.  Psychiatric: Awakens to voice and tactile stimulation but remains sedated and drifts off to sleep easily. Able to accurately assess orientation but appears somewhat oriented to person and place.    Labs on Admission: I have personally reviewed following labs and imaging studies  CBC:  Recent Labs Lab 08/31/16 1319  WBC 12.1*  HGB 14.5  HCT 43.8  MCV 89.6  PLT 0000000   Basic Metabolic Panel:  Recent Labs Lab 08/31/16 1319  NA 136  K 3.7  CL 101  CO2 25  GLUCOSE 173*  BUN 11  CREATININE 0.95  CALCIUM 9.3   GFR: Estimated Creatinine Clearance: 137.5 mL/min (by C-G formula based on SCr of 0.95 mg/dL). Liver Function Tests:  Recent Labs Lab 09/01/16 0813  AST 23  ALT 25  ALKPHOS 76  BILITOT 0.3  PROT 6.4*  ALBUMIN 3.6   No results for input(s): LIPASE, AMYLASE in the last 168 hours. No results for input(s): AMMONIA in the last 168 hours. Coagulation Profile:  Recent Labs Lab 09/01/16 0808  INR 1.06   Cardiac Enzymes: No results for input(s): CKTOTAL, CKMB, CKMBINDEX, TROPONINI in the last 168 hours. BNP (last 3 results) No results for input(s): PROBNP in the last 8760 hours. HbA1C: No results for input(s): HGBA1C in the last 72 hours. CBG: No  results for input(s): GLUCAP in the last 168 hours. Lipid Profile: No results for input(s): CHOL, HDL, LDLCALC, TRIG, CHOLHDL, LDLDIRECT in the last 72 hours. Thyroid Function Tests: No results for input(s): TSH, T4TOTAL, FREET4, T3FREE, THYROIDAB in the last 72 hours. Anemia Panel: No results for input(s): VITAMINB12, FOLATE, FERRITIN, TIBC, IRON, RETICCTPCT in the last 72 hours. Urine analysis:    Component Value Date/Time   COLORURINE YELLOW 09/01/2016 0800   APPEARANCEUR CLEAR 09/01/2016 0800   LABSPEC 1.009 09/01/2016 0800   PHURINE 6.0 09/01/2016 0800   GLUCOSEU NEGATIVE 09/01/2016 0800   HGBUR NEGATIVE 09/01/2016 0800   BILIRUBINUR NEGATIVE 09/01/2016 0800   KETONESUR NEGATIVE 09/01/2016 0800   PROTEINUR NEGATIVE 09/01/2016 0800   UROBILINOGEN 0.2 10/19/2011 1017   NITRITE NEGATIVE 09/01/2016 0800   LEUKOCYTESUR NEGATIVE 09/01/2016 0800   Sepsis Labs: @LABRCNTIP (procalcitonin:4,lacticidven:4) ) Recent Results (from the past 240 hour(s))  Surgical pcr screen     Status: None   Collection Time: 08/31/16  2:06 PM  Result Value Ref Range Status   MRSA, PCR NEGATIVE NEGATIVE Final   Staphylococcus aureus NEGATIVE NEGATIVE Final    Comment:        The Xpert SA Assay (FDA approved for NASAL specimens in patients over 43 years of age), is one component of a comprehensive surveillance program.  Test performance has been validated by [facility] for patients greater than or equal to 38 year old. It is not intended to diagnose infection nor to guide or monitor treatment.      Radiological Exams on Admission: Dg Lumbar Spine 2-3 Views  Result Date: 09/01/2016 CLINICAL DATA:  L4-5 laminectomy EXAM: LUMBAR SPINE - 2-3 VIEW COMPARISON:  Lumbar MRI May 27, 2016 FINDINGS: Cross-table lateral lumbar image time stamped 9:50: 56 submitted. Metallic probe tips are posterior to the midportion of the L5 vertebral body and posterior to the S1-2 interspace respectively. There is  moderate disc space narrowing at L4-5. On the second submitted cross-table lateral image time stamped [id number], metallic probe tip is posterior to the midportion of the L5 vertebral body. Cutting tool overlies  the L5 spinous process. There is moderate disc space narrowing at L4-5. No fracture or spondylolisthesis. IMPRESSION: On the final submitted cross-table lateral image, metallic probe tip is posterior to the midportion of the L5 vertebral body. Cutting tool overlies the L5 spinous process. No fracture or spondylolisthesis. Moderate disc space narrowing is noted at L4-5. Electronically Signed   By: Lowella Grip III M.D.   On: 09/01/2016 14:04   Dg Chest Port 1 View  Result Date: 09/01/2016 CLINICAL DATA:  Hypoxemia EXAM: PORTABLE CHEST 1 VIEW COMPARISON:  08/13/2016 FINDINGS: Cardiomediastinal silhouette is unremarkable. There is mild interstitial prominence bilaterally. Mild edema or pneumonitis cannot be excluded. No segmental infiltrate. No pneumothorax. IMPRESSION: Mild interstitial prominence bilaterally. Mild edema or pneumonitis cannot be excluded. No segmental infiltrate. No pneumothorax. Electronically Signed   By: Lahoma Crocker M.D.   On: 09/01/2016 14:24    EKG: (Independently reviewed) preoperative: Sinus rhythm with ventricular rate 85 bpm, QTC 466 ms, no ischemic changes  Assessment/Plan Principal Problem:   Acute respiratory failure with hypoxia  -O2 sats dipped to <85% in PACU -Differential includes undiagnosed sleep apnea now exacerbated post receipt of anesthesia and narcotic pain medications; also possible undiagnosed CHF versus flash pulmonary edema from mildly uncontrolled high blood pressure -Treat underlying causes -Supportive care with oxygen and CPAP  Active Problems:   Suspected OSA (obstructive sleep apnea) and obesity hypoventilation -Check ABG -Continue CPAP prn -Frequent reevaluation of respiratory status (stepdown) -Recommend outpatient PSG -Minimize  narcotics and other sedating medications -Followed by pulmonary medicine/[facility] as an outpatient for pulmonary nodules seen on CT of chest (ongoing tobacco abuse)    Acute pulmonary edema  -Control blood pressure -Was given Lasix 10 mg IV 1 in PACU -Lasix 40 mg IV every 12 hours for 3 doses with first dose now -Echocardiogram    Uncontrolled hypertension -Continue preadmission medications: Lisinopril, HCTZ -prn Apresoline IV    Hypothyroidism -Continue Synthroid    Anxiety and depression -Continue preadmission Xanax, Pristiq, Lexapro, Restoril and Effexor   Dyslipidemia -Continue Lipitor and Zetia    Spinal stenosis, lumbar region, 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 ANP-BC [facility] Pager [phone]   If 7PM-7AM, please contact night-coverage [email or web address] Password [facility]  09/01/2016, 2:38 PM

## 2016-09-01 NOTE — Progress Notes (Signed)
Pharmacy Antibiotic Note  Nathan Aguilar is a 45 y.o. male admitted on 09/01/2016 for lumbar surgery.  Pharmacy has been consulted for Vancomycin dosing x 1 post-op for surgical prophylaxis.  No drain in place.  Vancomycin 1500 mg IV given pre-op at 9am. Good renal function.  Plan:  Vancomycin 1500 mg IV x 1 at 9pm tonight.  No follow-up needed.  Height: 6' (182.9 cm) Weight: (!) 307 lb 15.7 oz (139.7 kg) IBW/kg (Calculated) : 77.6  Temp (24hrs), Avg:98.2 F (36.8 C), Min:97.7 F (36.5 C), Max:98.9 F (37.2 C)   Recent Labs Lab 08/31/16 1319  WBC 12.1*  CREATININE 0.95    Estimated Creatinine Clearance: 139.2 mL/min (by C-G formula based on SCr of 0.95 mg/dL).    Allergies  Allergen Reactions  . Penicillins Hives, Nausea And Vomiting and Other (See Comments)     Has patient had a PCN reaction causing immediate rash, facial/tongue/throat swelling, SOB or lightheadedness with hypotension:   # # YES # #  Has patient had a PCN reaction causing severe rash involving mucus membranes or skin necrosis:  # # YES # #  Has patient had a PCN reaction that REQUIRED HOSPITALIZATION    # # YES # # Has patient had a PCN reaction occurring within the last 10 years: No If all of the above answers are "NO", then may proceed with Cephalosporin use.     Antimicrobials this admission:   Vancomycin 11/21 pre-op and post-op x 1  Microbiology results:  n/a  Thank you for allowing pharmacy to be a part of this patient's care.  Arty Baumgartner, [place] Pager: [phone] 09/01/2016 6:25 PM

## 2016-09-01 NOTE — Brief Op Note (Signed)
09/01/2016  12:16 PM  PATIENT:  Nathan Aguilar  45 y.o. male  PRE-OPERATIVE DIAGNOSIS:  L4-5 bilateral lateral recess stenosis, left carpal tunnel syndrome  POST-OPERATIVE DIAGNOSIS:  L4-5 bilateral lateral recess stenosis  PROCEDURE:  Procedure(s): BILATERAL PARTIAL HEMILAMINECTOMY L4-5 (Bilateral)  SURGEON:  Surgeon(s) and Role:    * [name], MD - Primary  PHYSICIAN ASSISTANT:Keifer Habib Ricard Dillon, PA-C   ANESTHESIA:   local and general , Dr. Orene Desanctis EBL:  Total I/O In: -  Out: 200 [Blood:200]  BLOOD ADMINISTERED:none  DRAINS: Urinary Catheter (Foley)   LOCAL MEDICATIONS USED:  MARCAINE and Amount: 30 ml  SPECIMEN:  No Specimen  DISPOSITION OF SPECIMEN:  N/A  COUNTS:  YES  TOURNIQUET:  * No tourniquets in log *  DICTATION: .Dragon Dictation  PLAN OF CARE: Admit for overnight observation  PATIENT DISPOSITION:  PACU - hemodynamically stable.   Delay start of Pharmacological VTE agent (>24hrs) due to surgical blood loss or risk of bleeding: yes

## 2016-09-02 ENCOUNTER — Other Ambulatory Visit (HOSPITAL_COMMUNITY): Payer: Self-pay

## 2016-09-02 ENCOUNTER — Observation Stay (HOSPITAL_COMMUNITY): Payer: Worker's Compensation

## 2016-09-02 ENCOUNTER — Encounter (HOSPITAL_COMMUNITY): Payer: Self-pay | Admitting: Specialist

## 2016-09-02 DIAGNOSIS — J9601 Acute respiratory failure with hypoxia: Secondary | ICD-10-CM

## 2016-09-02 DIAGNOSIS — G4733 Obstructive sleep apnea (adult) (pediatric): Secondary | ICD-10-CM | POA: Diagnosis not present

## 2016-09-02 DIAGNOSIS — M48062 Spinal stenosis, lumbar region with neurogenic claudication: Secondary | ICD-10-CM

## 2016-09-02 DIAGNOSIS — I10 Essential (primary) hypertension: Secondary | ICD-10-CM

## 2016-09-02 LAB — CBC WITH DIFFERENTIAL/PLATELET
BASOS ABS: 0 10*3/uL (ref 0.0–0.1)
BASOS PCT: 0 %
EOS ABS: 0.2 10*3/uL (ref 0.0–0.7)
Eosinophils Relative: 1 %
HCT: 39.5 % (ref 39.0–52.0)
HEMOGLOBIN: 12.6 g/dL — AB (ref 13.0–17.0)
LYMPHS ABS: 2.9 10*3/uL (ref 0.7–4.0)
LYMPHS PCT: 17 %
MCH: 29 pg (ref 26.0–34.0)
MCHC: 31.9 g/dL (ref 30.0–36.0)
MCV: 90.8 fL (ref 78.0–100.0)
MONO ABS: 1.4 10*3/uL — AB (ref 0.1–1.0)
Monocytes Relative: 8 %
NEUTROS ABS: 12.5 10*3/uL — AB (ref 1.7–7.7)
Neutrophils Relative %: 74 %
PLATELETS: 231 10*3/uL (ref 150–400)
RBC: 4.35 MIL/uL (ref 4.22–5.81)
RDW: 14.8 % (ref 11.5–15.5)
WBC: 17 10*3/uL — ABNORMAL HIGH (ref 4.0–10.5)

## 2016-09-02 LAB — BASIC METABOLIC PANEL
Anion gap: 8 (ref 5–15)
BUN: 14 mg/dL (ref 6–20)
CALCIUM: 8.3 mg/dL — AB (ref 8.9–10.3)
CHLORIDE: 104 mmol/L (ref 101–111)
CO2: 26 mmol/L (ref 22–32)
CREATININE: 0.99 mg/dL (ref 0.61–1.24)
GFR calc non Af Amer: >60 mL/min (ref >60)
Glucose, Bld: 152 mg/dL — ABNORMAL HIGH (ref 65–99)
Potassium: 4.2 mmol/L (ref 3.5–5.1)
SODIUM: 138 mmol/L (ref 135–145)

## 2016-09-02 LAB — TROPONIN I: Troponin I: <0.03 ng/mL (ref <0.03)

## 2016-09-02 MED ORDER — OXYCODONE-ACETAMINOPHEN 5-325 MG PO TABS: 1.0000 | ORAL_TABLET | ORAL | 0 refills | Status: DC | PRN

## 2016-09-02 MED ORDER — METHOCARBAMOL 500 MG PO TABS: 500.0000 mg | ORAL_TABLET | Freq: Four times a day (QID) | ORAL | 1 refills | Status: AC | PRN

## 2016-09-02 MED FILL — Thrombin For Soln 20000 Unit: CUTANEOUS | Qty: 1 | Status: AC

## 2016-09-02 NOTE — Care Management Note (Signed)
Case Management Note  Patient Details  Name: Nathan Aguilar MRN: [id number] Date of Birth: [birth year]  Subjective/Objective:  S/p bilateral partial hemilamnectomy l4-5  NCM spoke with patient , he states he does not want HHPT, or [place], but he will need a 3 n 1 and he chose [facility] , referral made to [facility] , she will bring up to patient's room.  Patient is for dc today.                  Action/Plan:   Expected Discharge Date:                  Expected Discharge Plan:  Home/Self Care  In-House Referral:     Discharge planning Services  CM Consult  Post Acute Care Choice:  Durable Medical Equipment Choice offered to:     DME Arranged:  3-N-1 DME Agency:  [place]:    [place] Agency:     Status of Service:  Completed, signed off  If discussed at [place] of Stay Meetings, dates discussed:    Additional Comments:  Zenon Mayo, RN 09/02/2016, 10:24 AM

## 2016-09-02 NOTE — Progress Notes (Signed)
Discussed and explained discharge instructions with patient and niece. Prescriptions and follow up visit given to pt. No complaints voiced at this time. Going home with niece with belongings.

## 2016-09-02 NOTE — Anesthesia Postprocedure Evaluation (Signed)
Anesthesia Post Note  Patient: Jerrin L Laughery  Procedure(s) Performed: Procedure(s) (LRB): BILATERAL PARTIAL HEMILAMINECTOMY L4-5 (Bilateral)  Patient location during evaluation: PACU Anesthesia Type: General Level of consciousness: awake and alert Pain management: pain level controlled Vital Signs Assessment: post-procedure vital signs reviewed and stable Respiratory status: spontaneous breathing, respiratory function stable and patient connected to face mask oxygen (pat required CPAP early postop to support resp) Cardiovascular status: blood pressure returned to baseline and stable Postop Assessment: no signs of nausea or vomiting Anesthetic complications: no    Last Vitals:  Vitals:   09/02/16 0429 09/02/16 0734  BP: 128/67 (!) 99/54  Pulse: 88 96  Resp: (!) 26 16  Temp: 36.9 C 37.3 C    Last Pain:  Vitals:   09/02/16 0734  TempSrc: Oral  PainSc: 0-No pain                 Evalina Tabak,JAMES TERRILL

## 2016-09-02 NOTE — Evaluation (Signed)
Occupational Therapy Evaluation Patient Details Name: Nathan Aguilar MRN: [id number] DOB: [birth year] Today's Date: 09/02/2016    History of Present Illness Patient is a 45 yo male s/p BILATERAL PARTIAL HEMILAMINECTOMY L4-5 (Bilateral) with post op hypoxia.   Clinical Impression   Pt reports he was independent with ADL PTA. Currently pt overall min guard for functional mobility and ADL with the exception of mod assist for LB ADL. Began back, safety, and ADL education with pt. Pt planning to d/c home alone with intermittent family supervision. Highly recommend HHOT for follow up to maximize independence and safety with ADL and functional mobility upon return home. Pt would benefit from continued skilled OT to address established goals.    Follow Up Recommendations  Home health OT;Supervision/Assistance - 24 hour    Equipment Recommendations  3 in 1 bedside comode (Adaptive equipment)    Recommendations for Other Services       Precautions / Restrictions Precautions Precautions: Back Precaution Booklet Issued: Yes (comment) Precaution Comments: Educated pt on back precautions during functional activities. Restrictions Weight Bearing Restrictions: No      Mobility Bed Mobility Overal bed mobility: Needs Assistance Bed Mobility: Rolling;Sidelying to Sit;Sit to Sidelying Rolling: Supervision Sidelying to sit: Min assist     Sit to sidelying: Min assist;HOB elevated General bed mobility comments: Min assist to power up to sitting position from sidelying. Min assist for LEs back to bed with HOB elevated due to pt feeling like he could not complete with HOB flat.  Transfers Overall transfer level: Needs assistance Equipment used: None Transfers: Sit to/from Stand Sit to Stand: Min guard         General transfer comment: Min guard for safety. Increased time needed with bed in elevated position.    Balance Overall balance assessment: Needs assistance Sitting-balance  support: Feet supported;No upper extremity supported Sitting balance-Leahy Scale: Good     Standing balance support: No upper extremity supported;During functional activity Standing balance-Leahy Scale: Fair                              ADL Overall ADL's : Needs assistance/impaired Eating/Feeding: Independent;Sitting   Grooming: Min guard;Standing   Upper Body Bathing: Set up;Supervision/ safety;Sitting   Lower Body Bathing: Moderate assistance;Sit to/from stand   Upper Body Dressing : Set up;Supervision/safety;Sitting   Lower Body Dressing: Moderate assistance;Sit to/from stand Lower Body Dressing Details (indicate cue type and reason): Pt unable to reach feet to don socks. Pt reports his neice can assist with ADL as needed but she will not be home with him all the time. Toilet Transfer: Min guard;Ambulation;BSC Toilet Transfer Details (indicate cue type and reason): Simulated by sit to stand from EOB with functional mobility in room. Educated on use of 3 in 1 over toilet.   Toileting - Clothing Manipulation Details (indicate cue type and reason): Educated on proper technique for peri care without twisting. Discussed what he can use as a toilet aide if needed and use of wet wipes.   Tub/Shower Transfer Details (indicate cue type and reason): Attempted simulated tub transfer but pt unable to clear feet high enough to get over side of tub. Discussed sponge bathing initially and use of 3 in 1 in tub as a seat. Educated on need for supervision for safety with tub transfer. Provided handout for use of 3 in 1 in tub. Functional mobility during ADLs: Min guard General ADL Comments: Educated pt on maintaining back  precautions during functional activities, log roll technique for bed mobility, keeping frequently used items at coutner top height, frequent mobility throughout the day upon return home.     Vision     Perception     Praxis      Pertinent Vitals/Pain Pain  Assessment: 0-10 Pain Score: 3  Pain Location: back Pain Descriptors / Indicators: Sore;Operative site guarding Pain Intervention(s): Monitored during session     Hand Dominance Right   Extremity/Trunk Assessment Upper Extremity Assessment Upper Extremity Assessment: Overall WFL for tasks assessed   Lower Extremity Assessment Lower Extremity Assessment: Defer to PT evaluation   Cervical / Trunk Assessment Cervical / Trunk Assessment: Other exceptions Cervical / Trunk Exceptions: s/p spinal sx   Communication Communication Communication: No difficulties   Cognition Arousal/Alertness: Awake/alert Behavior During Therapy: WFL for tasks assessed/performed Overall Cognitive Status: Within Functional Limits for tasks assessed                     General Comments       Exercises       Shoulder Instructions      Home Living Family/patient expects to be discharged to:: Private residence Living Arrangements: Alone Available Help at Discharge: Family;Available PRN/intermittently Type of Home: House Home Access: Stairs to enter [employer] of Steps: 8 Entrance Stairs-Rails: Can reach both Home Layout: One level     Bathroom Shower/Tub: Teacher, early years/pre: Standard     Home Equipment: Cane - single point          Prior Functioning/Environment Level of Independence: Independent with assistive device(s)        Comments: uses cane         OT Problem List: Decreased activity tolerance;Impaired balance (sitting and/or standing);Decreased knowledge of use of DME or AE;Decreased knowledge of precautions;Obesity;Pain   OT Treatment/Interventions: Self-care/ADL training;Energy conservation;DME and/or AE instruction;Therapeutic activities;Patient/family education;Balance training    OT Goals(Current goals can be found in the care plan section) Acute Rehab OT Goals Patient Stated Goal: to go home OT Goal Formulation: With  patient Time For Goal Achievement: 09/16/16 Potential to Achieve Goals: Good ADL Goals Pt Will Perform Lower Body Bathing: with supervision;sit to/from stand;with adaptive equipment Pt Will Perform Lower Body Dressing: with supervision;with adaptive equipment;sit to/from stand Pt Will Transfer to Toilet: with supervision;ambulating;bedside commode Pt Will Perform Toileting - Clothing Manipulation and hygiene: with supervision;sit to/from stand;with adaptive equipment Additional ADL Goal #1: Pt will independently verbally recall 3/3 back precautions and maintain throughout ADL. Additional ADL Goal #2: Pt will perform all bed mobility at a supervision level as precursor to ADL and functional mobility.  OT Frequency: Min 2X/week   Barriers to D/C: Decreased caregiver support  pt lives alone       Co-evaluation              End of Session Nurse Communication: Mobility status  Activity Tolerance: Patient tolerated treatment well Patient left: in bed;with call bell/phone within reach   Time: 0808-0823 OT Time Calculation (min): 15 min Charges:  OT General Charges $OT Visit: 1 Procedure OT Evaluation $OT Eval Moderate Complexity: 1 Procedure G-Codes: OT G-codes **NOT FOR INPATIENT CLASS** Functional Assessment Tool Used: Clinical judgement Functional Limitation: Self care Self Care Current Status [id number]): At least 20 percent but less than 40 percent impaired, limited or restricted Self Care Goal Status [id number]): At least 1 percent but less than 20 percent impaired, limited or restricted   Binnie Kand M.S., OTR/L  Pager: [phone]  09/02/2016, 8:33 AM

## 2016-09-02 NOTE — Progress Notes (Signed)
     Subjective: 1 Day Post-Op Procedure(s) (LRB): BILATERAL PARTIAL HEMILAMINECTOMY L4-5 (Bilateral) Awake, alert and oriented x 4. No Head ache, has been in reclining and sitting position since surgery due to desaturation on O2, No SOB, no oxygen support at this time. Normally lies on side when sleeping.  Patient reports pain as mild.    Objective:   VITALS:  Temp:  [97.7 F (36.5 C)-99.1 F (37.3 C)] 98.4 F (36.9 C) (11/22 0429) Pulse Rate:  [88-130] 88 (11/22 0429) Resp:  [18-41] 26 (11/22 0429) BP: (128-180)/(57-88) 128/67 (11/22 0429) SpO2:  [77 %-99 %] 95 % (11/22 0429) FiO2 (%):  [60 %] 60 % (11/21 1340) Weight:  [136.4 kg (300 lb 11.2 oz)-139.7 kg (307 lb 15.7 oz)] 139.7 kg (307 lb 15.7 oz) (11/21 1736)  Neurologically intact ABD soft Neurovascular intact Sensation intact distally Intact pulses distally Dorsiflexion/Plantar flexion intact Incision: no drainage   LABS  Recent Labs  08/31/16 1319 09/02/16 0226  HGB 14.5 12.6*  WBC 12.1* 17.0*  PLT 261 231    Recent Labs  08/31/16 1319 09/02/16 0226  NA 136 138  K 3.7 4.2  CL 101 104  CO2 25 26  BUN 11 14  CREATININE 0.95 0.99  GLUCOSE 173* 152*    Recent Labs  09/01/16 0808  INR 1.06     Assessment/Plan: 1 Day Post-Op Procedure(s) (LRB): BILATERAL PARTIAL HEMILAMINECTOMY L4-5 (Bilateral)  Advance diet Up with therapy D/C IV fluids Discharge home with home health  Medicine to see and  Will discharge post medicine eval  [name] 09/02/2016, 7:19 AM

## 2016-09-02 NOTE — Clinical Social Work Note (Signed)
CSW acknowledges SNF consult. PT recommending no PT follow up.  CSW signing off. Consult again if any social work needs arise.  Dayton Scrape, [place]

## 2016-09-02 NOTE — Evaluation (Addendum)
Physical Therapy Evaluation Patient Details Name: Nathan Aguilar MRN: [id number] DOB: [birth year] Today's Date: 09/02/2016   History of Present Illness  Patient is a 45 yo male s/p BILATERAL PARTIAL HEMILAMINECTOMY L4-5 (Bilateral) with post op hypoxia.  Clinical Impression  Patient seen for education and mobility assessment s/p spinal surgery. Patient mobilizing well, ambulated and performed stair negotiation. Patient receptive to education [id number] precautions, mobility expectations and safety. No further acute needs. Will sign off.     Follow Up Recommendations No PT follow up    Equipment Recommendations  3in1 (PT)    Recommendations for Other Services       Precautions / Restrictions Precautions Precautions: Back Precaution Booklet Issued: Yes (comment) Precaution Comments: reviewed verbally with teachback      Mobility  Bed Mobility Overal bed mobility: Needs Assistance Bed Mobility: Rolling;Sidelying to Sit Rolling: Supervision Sidelying to sit: Min assist       General bed mobility comments: Min assist to power up to sitting position from sidelying  Transfers Overall transfer level: Needs assistance   Transfers: Sit to/from Stand Sit to Stand: Supervision         General transfer comment: supervision for safety, no physical assist required  Ambulation/Gait Ambulation/Gait assistance: Supervision Ambulation Distance (Feet): 260 Feet Assistive device: None Gait Pattern/deviations: Step-through pattern;Decreased stride length;Drifts right/left Gait velocity: decreased Gait velocity interpretation: Below normal speed for age/gender General Gait Details: some modest instability with initial ambulation but improved with cues and distance. VSd for increased cadence  Stairs Stairs: Yes Stairs assistance: Supervision Stair Management: One rail Left Number of Stairs: 4 General stair comments: VCs for step to technique and safety  Wheelchair Mobility     Modified Rankin (Stroke Patients Only)       Balance                                             Pertinent Vitals/Pain Pain Assessment: 0-10 Pain Score: 3  Pain Location: back Pain Descriptors / Indicators: Operative site guarding Pain Intervention(s): Monitored during session    Home Living Family/patient expects to be discharged to:: Private residence Living Arrangements: Alone Available Help at Discharge: Family Type of Home: House Home Access: Stairs to enter Entrance Stairs-Rails: Can reach both Entrance Stairs-Number of Steps: 8 Home Layout: One level Home Equipment: Cane - single point      Prior Function Level of Independence: Independent with assistive device(s)         Comments: uses cane      Hand Dominance   Dominant Hand: Right    Extremity/Trunk Assessment   Upper Extremity Assessment: Overall WFL for tasks assessed           Lower Extremity Assessment: Overall WFL for tasks assessed      Cervical / Trunk Assessment:  (s/p spinal surgery)  Communication   Communication: No difficulties  Cognition Arousal/Alertness: Awake/alert Behavior During Therapy: WFL for tasks assessed/performed Overall Cognitive Status: Within Functional Limits for tasks assessed                      General Comments General comments (skin integrity, edema, etc.): educated on car transfers and mobility expectations    Exercises     Assessment/Plan    PT Assessment Patent does not need any further PT services  PT Problem List  PT Treatment Interventions      PT Goals (Current goals can be found in the Care Plan section)  Acute Rehab PT Goals Patient Stated Goal: to go home PT Goal Formulation: All assessment and education complete, DC therapy    Frequency     Barriers to discharge        Co-evaluation PT/OT/SLP Co-Evaluation/Treatment:  ([place] tailed for d/c)             End of Session    Activity Tolerance: No increased pain Patient left: in bed;with call bell/phone within reach (sitting EOB with OT) Nurse Communication: Mobility status;Precautions    Functional Assessment Tool Used: clinical judgement Functional Limitation: Mobility: Walking and moving around Mobility: Walking and Moving Around Current Status [id number]): At least 1 percent but less than 20 percent impaired, limited or restricted Mobility: Walking and Moving Around Goal Status [phone]): At least 1 percent but less than 20 percent impaired, limited or restricted Mobility: Walking and Moving Around Discharge Status [phone]): At least 1 percent but less than 20 percent impaired, limited or restricted    Time: 0755-0810 PT Time Calculation (min) (ACUTE ONLY): 15 min   Charges:   PT Evaluation $PT Eval Low Complexity: 1 Procedure     PT G Codes:   PT G-Codes **NOT FOR INPATIENT CLASS** Functional Assessment Tool Used: clinical judgement Functional Limitation: Mobility: Walking and moving around Mobility: Walking and Moving Around Current Status [id number]): At least 1 percent but less than 20 percent impaired, limited or restricted Mobility: Walking and Moving Around Goal Status [phone]): At least 1 percent but less than 20 percent impaired, limited or restricted Mobility: Walking and Moving Around Discharge Status [phone]): At least 1 percent but less than 20 percent impaired, limited or restricted    Duncan Dull 09/02/2016, 8:19 AM Alben Deeds, PT DPT  [phone]

## 2016-09-02 NOTE — Progress Notes (Signed)
Seen and examined this morning. His hypoxia is completely resolve-he is now on room air. Blood pressure is well controlled this morning. He has no new complaints, pain is adequately well controlled.  Vitals: Blood pressure 99/54, O2 saturation in the mid 90s in room air.  On exam: Lying comfortably in bed-not in any distress Chest: Bilaterally clear-no rales CVS: S1-S2 regular-no murmurs Lower extremity: No edema  Impression Acute hypoxemic respiratory failure-very transient-has resolved. Likely multifactorial-probably from postop medications/sedation-not sure if patient had noncardiogenic pulmonary edema playing a role. He has no chronic cough features of CHF, furthermore BNP was within normal limits. Uncontrolled hypertension: Probably anxiety/acute illness/postop adrenergic stimulation-blood pressure now significantly better controlled Probably has undiagnosed sleep apnea  Plan: Would discontinue IV Lasix-and resume patient's usual antihypertensive regimen and other medications on discharge Patient has a previously scheduled appointment for a sleep study next week-he has been encouraged to keep this appointment Recommend outpatient echocardiogram Okay for discharge from my point of view-I have spoken with Dr. Louanne Skye, at bedside.

## 2016-09-08 NOTE — Telephone Encounter (Signed)
Patient states he is having some neck pain, he wants to know if Dr.Nitka recommends a neck brace, he says he cant keep his neck still  cb [phone]

## 2016-09-08 NOTE — Discharge Summary (Signed)
Physician Discharge Summary      Patient ID: Nathan Aguilar MRN: [id number] DOB/AGE: 47-Sep-1972 47 y.o.  Admit date: 09/01/2016 Discharge date: 09/02/2016  Admission Diagnoses:  Principal Problem:   Acute respiratory failure with hypoxia [facility]) Active Problems:   Spinal stenosis, lumbar region, with neurogenic claudication   CSF leak   Spinal stenosis of lumbar region with neurogenic claudication   Uncontrolled hypertension   Suspected OSA (obstructive sleep apnea) andobesity hypoventilation   Acute pulmonary edema (HCC)   Hypothyroidism   Anxiety and depression   Discharge Diagnoses:  Same  Past Medical History:  Diagnosis Date  . Anxiety   . Asthma   . Basal cell carcinoma    "right hand; inside my left foot"  . Chronic bronchitis ([place])   . Chronic lower back pain    "since worker's comp accident 05/10/2016" (09/01/2016)  . Depression   . Dyspnea   . GERD (gastroesophageal reflux disease)   . 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 Heart murmur   . High cholesterol   . Hypertension   . Hypothyroidism   . Pneumonia    "once or twice" (09/01/2016)    Surgeries: Procedure(s): BILATERAL PARTIAL HEMILAMINECTOMY L4-5 on 09/01/2016   Consultants:   Discharged Condition: Improved  Hospital Course: Nathan Aguilar is an 45 y.o. male who was admitted 09/01/2016 with a chief complaint of No chief complaint on file. , and found to have a diagnosis of Acute respiratory failure with hypoxia ([place]).  He was brought to the operating room on 09/01/2016 and underwent the above named procedures. His surgery was complicated by dural tear which was small and underwent primary repair with suture and duraseal   He was given perioperative antibiotics:  Anti-infectives    Start     Dose/Rate Route Frequency Ordered Stop   09/01/16 2100  vancomycin (VANCOCIN) 1,500 mg in sodium chloride 0.9 % 500 mL IVPB     1,500 mg 250 mL/hr over 120 Minutes Intravenous  Once  09/01/16 1823 09/01/16 2354   09/01/16 0815  vancomycin (VANCOCIN) IVPB 1000 mg/200 mL premix  Status:  Discontinued     1,000 mg 200 mL/hr over 60 Minutes Intravenous On call to O.R. 09/01/16 [id number] 09/01/16 0813   09/01/16 0815  vancomycin (VANCOCIN) 1,500 mg in sodium chloride 0.9 % 500 mL IVPB     1,500 mg 250 mL/hr over 120 Minutes Intravenous On call to O.R. 09/01/16 0813 09/01/16 1035    Post operatively he recovered in the PACU with difficulty with oxygen saturation, and difficulty lying flat on his back. Intraoperatively he had a dural repair for a dural tear that occurred during decompression. Due to respiratory difficulty the [facility] required elevation to promote pulmonary function. Additionally he was started on CPAP and oxygen saturations improved as he cleared sedation due to anesthesia. A medicine consult requested. He was placed in a stepdown unit due to respiratory depression immediately post anesthesia and over the next 18 hours demonstrated excellent recovery of pulmonary function with no sign of respiratory distress on POD#1. On POD# 1 he was awake alert and oriented x 4. He was able to roll to side to have dressing changed. Incision was dry without fluctuance. No nuchal signs or headaches. His legs were neurologically normal. He was able to void without difficulty and ambulated without assistance. He was tolerating po narcotic pain medications for controlling his discomfort. He is aware of the dural tear and  Advised to alert  [place] if there were any drainage from his incision or headaches. He was discharged home on POD#1, all questions were answered .   He was given sequential compression devices and early ambulation for DVT prophylaxis.  He benefited maximally from their hospital stay and there were no complications.    Recent vital signs:  Vitals:   09/02/16 0734 09/02/16 1141  BP: (!) 99/54 (!) 144/63  Pulse: 96 88  Resp: 16 18  Temp: 99.1 F (37.3 C) 98.7 F (37.1 C)     Recent laboratory studies:  Results for orders placed or performed during the hospital encounter of 09/01/16  Protime-INR  Result Value Ref Range   Prothrombin Time 13.9 11.4 - 15.2 seconds   INR 1.06   APTT  Result Value Ref Range   aPTT 43 (H) 24 - 36 seconds  Urinalysis, Routine w reflex microscopic (not at [facility])  Result Value Ref Range   Color, Urine YELLOW YELLOW   APPearance CLEAR CLEAR   Specific Gravity, Urine 1.009 1.005 - 1.030   pH 6.0 5.0 - 8.0   Glucose, UA NEGATIVE NEGATIVE mg/dL   Hgb urine dipstick NEGATIVE NEGATIVE   Bilirubin Urine NEGATIVE NEGATIVE   Ketones, ur NEGATIVE NEGATIVE mg/dL   Protein, ur NEGATIVE NEGATIVE mg/dL   Nitrite NEGATIVE NEGATIVE   Leukocytes, UA NEGATIVE NEGATIVE  Hepatic function panel  Result Value Ref Range   Total Protein 6.4 (L) 6.5 - 8.1 g/dL   Albumin 3.6 3.5 - 5.0 g/dL   AST 23 15 - 41 U/L   ALT 25 17 - 63 U/L   Alkaline Phosphatase 76 38 - 126 U/L   Total Bilirubin 0.3 0.3 - 1.2 mg/dL   Bilirubin, Direct 0.1 0.1 - 0.5 mg/dL   Indirect Bilirubin 0.2 (L) 0.3 - 0.9 mg/dL  Brain natriuretic peptide  Result Value Ref Range   B Natriuretic Peptide 9.7 0.0 - 100.0 pg/mL  Blood gas, arterial  Result Value Ref Range   FIO2 60.00    Delivery systems VENTILATOR    Mode PRESSURE CONTROL    LHR 15 resp/min   Pressure control 12 cm H20   pH, Arterial 7.377 7.350 - 7.450   pCO2 arterial 46.9 32.0 - 48.0 mmHg   pO2, Arterial 61.6 (L) 83.0 - 108.0 mmHg   Bicarbonate 27.1 20.0 - 28.0 mmol/L   Acid-Base Excess 2.3 (H) 0.0 - 2.0 mmol/L   O2 Saturation 91.5 %   Patient temperature 97.5    Collection site LEFT RADIAL    Drawn by [id number]    Sample type ARTERIAL DRAW    Allens test (pass/fail) PASS PASS  CBC with Differential/Platelet  Result Value Ref Range   WBC 17.0 (H) 4.0 - 10.5 K/uL   RBC 4.35 4.22 - 5.81 MIL/uL   Hemoglobin 12.6 (L) 13.0 - 17.0 g/dL   HCT 39.5 39.0 - 52.0 %   MCV 90.8 78.0 - 100.0 fL   MCH 29.0 26.0 -  34.0 pg   MCHC 31.9 30.0 - 36.0 g/dL   RDW 14.8 11.5 - 15.5 %   Platelets 231 150 - 400 K/uL   Neutrophils Relative % 74 %   Lymphocytes Relative 17 %   Monocytes Relative 8 %   Eosinophils Relative 1 %   Basophils Relative 0 %   Neutro Abs 12.5 (H) 1.7 - 7.7 K/uL   Lymphs Abs 2.9 0.7 - 4.0 K/uL   Monocytes Absolute 1.4 (H) 0.1 - 1.0 K/uL   Eosinophils Absolute 0.2  0.0 - 0.7 K/uL   Basophils Absolute 0.0 0.0 - 0.1 K/uL   WBC Morphology ATYPICAL LYMPHOCYTES   Basic metabolic panel  Result Value Ref Range   Sodium 138 135 - 145 mmol/L   Potassium 4.2 3.5 - 5.1 mmol/L   Chloride 104 101 - 111 mmol/L   CO2 26 22 - 32 mmol/L   Glucose, Bld 152 (H) 65 - 99 mg/dL   BUN 14 6 - 20 mg/dL   Creatinine, Ser 0.99 0.61 - 1.24 mg/dL   Calcium 8.3 (L) 8.9 - 10.3 mg/dL   GFR calc non Af Amer >60 >60 mL/min   GFR calc Af Amer >60 >60 mL/min   Anion gap 8 5 - 15  Troponin I (q 6hr x 3)  Result Value Ref Range   Troponin I 0.03 (HH) <0.03 ng/mL  Troponin I (q 6hr x 3)  Result Value Ref Range   Troponin I <0.03 <0.03 ng/mL  Troponin I  Result Value Ref Range   Troponin I <0.03 <0.03 ng/mL    Discharge Medications:     Medication List    TAKE these medications   ALPRAZolam 1 MG tablet Commonly known as:  XANAX Take 1 mg by mouth 3 (three) times daily as needed for anxiety.   ARNUITY ELLIPTA 100 MCG/ACT Aepb Generic drug:  Fluticasone Furoate Inhale 1 puff into the lungs daily.   aspirin EC 81 MG tablet Take 81 mg by mouth daily.   atorvastatin 40 MG tablet Commonly known as:  LIPITOR Take 40 mg by mouth daily.   diclofenac sodium 1 % Gel Commonly known as:  VOLTAREN Apply 2-4 g topically 4 (four) times daily.   escitalopram 20 MG tablet Commonly known as:  LEXAPRO Take 20 mg by mouth every morning.   ezetimibe 10 MG tablet Commonly known as:  ZETIA Take 10 mg by mouth daily.   furosemide 40 MG tablet Commonly known as:  LASIX Take 40 mg by mouth.   levothyroxine  75 MCG tablet Commonly known as:  SYNTHROID, LEVOTHROID Take 75 mcg by mouth daily before breakfast.   lisinopril-hydrochlorothiazide 20-12.5 MG tablet Commonly known as:  PRINZIDE,ZESTORETIC Take 1 tablet by mouth daily.   methocarbamol 500 MG tablet Commonly known as:  ROBAXIN Take 1 tablet (500 mg total) by mouth every 6 (six) hours as needed for muscle spasms.   oxyCODONE-acetaminophen 10-325 MG tablet Commonly known as:  PERCOCET Take 1 tablet by mouth every 4 (four) hours as needed for pain. What changed:  Another medication with the same name was added. Make sure you understand how and when to take each.   oxyCODONE-acetaminophen 5-325 MG tablet Commonly known as:  PERCOCET/ROXICET Take 1-2 tablets by mouth every 4 (four) hours as needed for moderate pain. What changed:  You were already taking a medication with the same name, and this prescription was added. Make sure you understand how and when to take each.   PROAIR HFA 108 (90 Base) MCG/ACT inhaler Generic drug:  albuterol Inhale 1 puff into the lungs every 6 (six) hours as needed for wheezing or shortness of breath.   temazepam 30 MG capsule Commonly known as:  RESTORIL Take 30 mg by mouth at bedtime as needed for sleep.   venlafaxine XR 75 MG 24 hr capsule Commonly known as:  EFFEXOR-XR Take 75 mg by mouth at bedtime.   Vitamin D 2000 units Caps Take 2,000 Units by mouth daily.       Diagnostic Studies: Dg Lumbar  Spine 2-3 Views  Result Date: 09/01/2016 CLINICAL DATA:  L4-5 laminectomy EXAM: LUMBAR SPINE - 2-3 VIEW COMPARISON:  Lumbar MRI May 27, 2016 FINDINGS: Cross-table lateral lumbar image time stamped 9:50: 56 submitted. Metallic probe tips are posterior to the midportion of the L5 vertebral body and posterior to the S1-2 interspace respectively. There is moderate disc space narrowing at L4-5. On the second submitted cross-table lateral image time stamped [id number], metallic probe tip is posterior to the  midportion of the L5 vertebral body. Cutting tool overlies the L5 spinous process. There is moderate disc space narrowing at L4-5. No fracture or spondylolisthesis. IMPRESSION: On the final submitted cross-table lateral image, metallic probe tip is posterior to the midportion of the L5 vertebral body. Cutting tool overlies the L5 spinous process. No fracture or spondylolisthesis. Moderate disc space narrowing is noted at L4-5. Electronically Signed   By: Lowella Grip III M.D.   On: 09/01/2016 14:04   Dg Chest Port 1 View  Result Date: 09/02/2016 CLINICAL DATA:  Shortness of breath. EXAM: PORTABLE CHEST 1 VIEW COMPARISON:  08/01/2016. FINDINGS: Cardiomegaly with with bilateral from interstitial prominence consistent congestive heart failure. Interval partial clearing from prior exam. No prominent pleural effusion. No pneumothorax. Prior cervical spine fusion . IMPRESSION: Congestive heart failure with pulmonary interstitial edema. Interval partial clearing from prior exam . Electronically Signed   By: Marcello Moores  Register   On: 09/02/2016 08:21   Dg Chest Port 1 View  Result Date: 09/01/2016 CLINICAL DATA:  Hypoxemia EXAM: PORTABLE CHEST 1 VIEW COMPARISON:  08/13/2016 FINDINGS: Cardiomediastinal silhouette is unremarkable. There is mild interstitial prominence bilaterally. Mild edema or pneumonitis cannot be excluded. No segmental infiltrate. No pneumothorax. IMPRESSION: Mild interstitial prominence bilaterally. Mild edema or pneumonitis cannot be excluded. No segmental infiltrate. No pneumothorax. Electronically Signed   By: Lahoma Crocker M.D.   On: 09/01/2016 14:24    Disposition: 01-Home or Self Care  Discharge Instructions    Call MD / Call 911    Complete by:  As directed    If you experience chest pain or shortness of breath, CALL 911 and be transported to the hospital emergency room.  If you develope a fever above 101 F, pus (white drainage) or increased drainage or redness at the wound, or  calf pain, call your surgeon's office.   Constipation Prevention    Complete by:  As directed    Drink plenty of fluids.  Prune juice may be helpful.  You may use a stool softener, such as Colace (over the counter) 100 mg twice a day.  Use MiraLax (over the counter) for constipation as needed.   Diet - low sodium heart healthy    Complete by:  As directed    Discharge instructions    Complete by:  As directed    No lifting greater than 10 lbs. Avoid bending, stooping and twisting. Walk in house for first week them may start to get out slowly increasing distance up to one quarter mile by 3 weeks post op. Keep incision dry for 3 days, may use tegaderm or similar water impervious dressing.   Driving restrictions    Complete by:  As directed    No driving for 3 weeks   Increase activity slowly as tolerated    Complete by:  As directed    Lifting restrictions    Complete by:  As directed    No lifting for 8 weeks      Follow-up Information    Daleen Bo  Louanne Skye, MD In 10 days.   Specialty:  Orthopedic Surgery Why:  For wound re-check.     Dec. 6, 2017      9:30 Am   with Dr. Coralyn Pear. Contact information: [place] [address] [place] Follow up.   Why:  3 n 1 will be brought up to patient's room Contact information: [address] [place]  [id number] [phone]            Signed: [name] 09/08/2016, 1:24 PM

## 2016-09-09 NOTE — Telephone Encounter (Signed)
Had an MRI and I do not think surgery is necessary, we can send him for a second opinion if he wishes. He has pain meds he is on post operatively so there is no other medication I would recommend in addition to what he is already taken. jen

## 2016-09-09 NOTE — Telephone Encounter (Signed)
Please advise 

## 2016-09-09 NOTE — Telephone Encounter (Signed)
Called and spoke with patient he would like to get a second opinion on his neck. Where would you like to refer him. Also patient states you spoke with him in office about sending him to a hand specialist since his hand is numb. Please advise.  Thanks.

## 2016-09-10 ENCOUNTER — Telehealth (INDEPENDENT_AMBULATORY_CARE_PROVIDER_SITE_OTHER): Payer: Self-pay | Admitting: Specialist

## 2016-09-10 NOTE — Telephone Encounter (Signed)
Please refer to Dr. Rolena Infante at [facility], Dr. Lynann Bologna at [facility]  or to Dr. Harl Bowie [facility].Marland Kitchen

## 2016-09-10 NOTE — Telephone Encounter (Signed)
Nathan Aguilar called asking if Dr. Louanne Skye will write a letter for him stating he'll be out of work for three months. He said he had surgery last Tuesday on November 22nd. Please give him a phone call regarding this if needed and let him know when/if the letter is ready so he can pick it up.  Pt's ph# [phone] Thank you.

## 2016-09-11 ENCOUNTER — Other Ambulatory Visit (INDEPENDENT_AMBULATORY_CARE_PROVIDER_SITE_OTHER): Payer: Self-pay | Admitting: Radiology

## 2016-09-11 DIAGNOSIS — M542 Cervicalgia: Secondary | ICD-10-CM

## 2016-09-11 NOTE — Telephone Encounter (Signed)
Called and advised letter is ready to be picked up at front desk  Thanks. Nira Conn

## 2016-09-11 NOTE — Telephone Encounter (Signed)
Referral to Dr. Harl Bowie put into patients chart Thanks. Nathan Aguilar

## 2016-09-15 NOTE — Telephone Encounter (Signed)
Pt called stating he needs dr note saying he will be out until feb 2nd. He lost the one he was given

## 2016-09-16 ENCOUNTER — Ambulatory Visit (INDEPENDENT_AMBULATORY_CARE_PROVIDER_SITE_OTHER): Payer: BLUE CROSS/BLUE SHIELD | Admitting: Family

## 2016-09-16 ENCOUNTER — Ambulatory Visit (INDEPENDENT_AMBULATORY_CARE_PROVIDER_SITE_OTHER): Payer: Self-pay | Admitting: Specialist

## 2016-09-16 ENCOUNTER — Encounter (INDEPENDENT_AMBULATORY_CARE_PROVIDER_SITE_OTHER): Payer: Self-pay | Admitting: Specialist

## 2016-09-16 VITALS — BP 142/81 | HR 97 | Ht 72.0 in | Wt 287.0 lb

## 2016-09-16 DIAGNOSIS — Z9889 Other specified postprocedural states: Secondary | ICD-10-CM

## 2016-09-16 MED ORDER — OXYCODONE-ACETAMINOPHEN 5-325 MG PO TABS: 1.0000 | ORAL_TABLET | Freq: Four times a day (QID) | ORAL | 0 refills | Status: AC | PRN

## 2016-09-16 NOTE — Progress Notes (Signed)
Post-Op Visit Note   Patient: Nathan Aguilar           Date of Birth: April 09, 1971           MRN: [id number] Visit Date: 09/16/2016 PCP: Harvie Junior, MD   Assessment & Plan:  Chief Complaint:  Patient is returning 2 weeks post op from bilateral partial hemilaminectomy L4-5 Bilateral. Patient states he is having a lot of pain. Rates the pain on a 9 out of 10. Patient states still having pain radiating into both legs. Knees are tingling when he first gets up. Cant sit or stand for long periods of time. Ambulates with cane. Incision looks good.  Visit Diagnoses:  1. History of lumbar laminectomy for spinal cord decompression     Plan: Refilled patient's Percocet prescription. Advised him that he must slowly increase activity over the next few weeks. I think that he has done too much. Follow-up in 4 weeks for recheck. Patient has a history of bilateral lower from edema and again encouraged to wear his TED hose.   Exam wound looks good. No drainage or signs of infection. Bilateral calves are nontender. Follow-Up Instructions: Return in about 4 weeks (around 10/14/2016).   Orders:  No orders of the defined types were placed in this encounter.  Meds ordered this encounter  Medications  . oxyCODONE-acetaminophen (ROXICET) 5-325 MG tablet    Sig: Take 1 tablet by mouth every 6 (six) hours as needed for severe pain.    Dispense:  60 tablet    Refill:  0     PMFS History: Patient Active Problem List   Diagnosis Date Noted  . Spinal stenosis, lumbar region, with neurogenic claudication 09/01/2016    Class: Acute  . CSF leak 09/01/2016    Class: Acute  . 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 Acute respiratory failure with hypoxia ([place]) 09/01/2016  . Uncontrolled hypertension 09/01/2016  . Suspected OSA (obstructive sleep apnea) andobesity hypoventilation 09/01/2016  . Acute pulmonary edema ([place]) 09/01/2016  . Hypothyroidism 09/01/2016  .  Anxiety and depression 09/01/2016  . HNP (herniated nucleus pulposus), cervical 10/19/2011    Class: History of  . Carpal tunnel syndrome, left 10/19/2011    Class: Diagnosis of   Past Medical History:  Diagnosis Date  . Anxiety   . Asthma   . Basal cell carcinoma    "right hand; inside my left foot"  . Chronic bronchitis ([place])   . Chronic lower back pain    "since worker's comp accident 05/10/2016" (09/01/2016)  . Depression   . Dyspnea   . GERD (gastroesophageal reflux disease)   . 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 Heart murmur   . High cholesterol   . Hypertension   . Hypothyroidism   . Pneumonia    "once or twice" (09/01/2016)    Family History  Problem Relation Age of Onset  . Leukemia Mother   . Heart disease Father     Past Surgical History:  Procedure Laterality Date  . ANTERIOR CERVICAL DECOMP/DISCECTOMY FUSION  10/23/2011   Procedure: ANTERIOR CERVICAL DECOMPRESSION/DISCECTOMY FUSION 1 LEVEL;  Surgeon: [name], MD;  Location: [place];  Service: Orthopedics;  Laterality: N/A;  Anterior cervical discectomy fusion C6-7 with local bone graft, plate, screws, Allograft bone graft  . BACK SURGERY    . BASAL CELL CARCINOMA EXCISION Bilateral    "right hand; inside my left foot"  . CARPAL TUNNEL RELEASE  Right   . CARPAL TUNNEL RELEASE  10/23/2011   Procedure: CARPAL TUNNEL RELEASE;  Surgeon: [name], MD;  Location: [place];  Service: Orthopedics;  Laterality: Left;  left carpal tunnel release  . HEMILAMINOTOMY LUMBAR SPINE Bilateral 09/01/2016   partial;  L4-5/notes 09/01/2016  . KNEE ARTHROSCOPY Right    "torn cartilage"  . LUMBAR LAMINECTOMY Bilateral 09/01/2016   Procedure: BILATERAL PARTIAL HEMILAMINECTOMY L4-5;  Surgeon: [name], MD;  Location: [place];  Service: Orthopedics;  Laterality: Bilateral;  . TONSILLECTOMY     Social History   Occupational History  . Not on file.   Social History Main Topics  . Smoking status: Current  Every Day Smoker    Packs/day: 1.00    Years: 27.00    Types: Cigarettes  . Smokeless tobacco: Never Used  . Alcohol use No  . Drug use: No  . Sexual activity: No

## 2016-09-16 NOTE — Telephone Encounter (Signed)
Done, patient received.

## 2016-09-16 NOTE — Patient Instructions (Signed)
Avoid bending twisting lifting.  Gradually increase her walking over the next several weeks. No strenuous activity. No driving.

## 2016-09-17 ENCOUNTER — Ambulatory Visit (INDEPENDENT_AMBULATORY_CARE_PROVIDER_SITE_OTHER): Payer: BLUE CROSS/BLUE SHIELD | Admitting: Specialist

## 2016-10-27 DIAGNOSIS — N529 Male erectile dysfunction, unspecified: Secondary | ICD-10-CM | POA: Diagnosis not present

## 2016-10-27 DIAGNOSIS — R935 Abnormal findings on diagnostic imaging of other abdominal regions, including retroperitoneum: Secondary | ICD-10-CM | POA: Diagnosis not present

## 2016-10-27 DIAGNOSIS — R140 Abdominal distension (gaseous): Secondary | ICD-10-CM | POA: Diagnosis not present

## 2016-10-27 DIAGNOSIS — R635 Abnormal weight gain: Secondary | ICD-10-CM

## 2016-10-27 DIAGNOSIS — R938 Abnormal findings on diagnostic imaging of other specified body structures: Secondary | ICD-10-CM | POA: Diagnosis not present

## 2016-10-29 ENCOUNTER — Ambulatory Visit: Payer: Self-pay | Admitting: Pulmonary Disease

## 2016-10-29 ENCOUNTER — Ambulatory Visit (INDEPENDENT_AMBULATORY_CARE_PROVIDER_SITE_OTHER): Payer: BLUE CROSS/BLUE SHIELD | Admitting: Specialist

## 2016-11-25 ENCOUNTER — Ambulatory Visit: Payer: Self-pay | Admitting: Neurology

## 2016-11-27 ENCOUNTER — Encounter: Payer: Self-pay | Admitting: Neurology

## 2016-12-04 ENCOUNTER — Telehealth (INDEPENDENT_AMBULATORY_CARE_PROVIDER_SITE_OTHER): Payer: Self-pay | Admitting: Specialist

## 2016-12-04 NOTE — Telephone Encounter (Signed)
Patient calling to requesting note for out of work until he is seen for his next visit in March.    The note needs to start from his last visit.  Please call patient when ready to pick up  cb  [phone]

## 2016-12-07 ENCOUNTER — Telehealth (INDEPENDENT_AMBULATORY_CARE_PROVIDER_SITE_OTHER): Payer: Self-pay | Admitting: Specialist

## 2016-12-07 NOTE — Telephone Encounter (Signed)
Jeanette @ [employer] called requesting ov note from 10/29/16.  I called her back and advised that patient no showed that appt.

## 2016-12-07 NOTE — Telephone Encounter (Signed)
Patient calling to requesting note for out of work until he is seen for his next visit in March.  The note needs to start from his last visit. Last OV  Was 09/16/2016 and his next appointment is 12/10/2016. Please advise.

## 2016-12-08 ENCOUNTER — Telehealth (INDEPENDENT_AMBULATORY_CARE_PROVIDER_SITE_OTHER): Payer: Self-pay | Admitting: Specialist

## 2016-12-08 NOTE — Telephone Encounter (Signed)
It will be March in 2 days, his surgery was in 08/2016. Seen in 09/2016 and not since. So that I have not seen him recent enough to evaluate his level of impairment. Generally patients are followed for a period of 3 months following surgery. I will give him a note when ever he is seen in follow up if I believe that he is unable to work. jen

## 2016-12-08 NOTE — Telephone Encounter (Signed)
Patient called wanting to know if his note for work could be faxed to his attorney. Fax # [phone]   Attention Fredricka Bonine [facility]

## 2016-12-10 ENCOUNTER — Ambulatory Visit (INDEPENDENT_AMBULATORY_CARE_PROVIDER_SITE_OTHER): Payer: BLUE CROSS/BLUE SHIELD | Admitting: Specialist

## 2016-12-10 NOTE — Telephone Encounter (Signed)
Patient didn't appointment today due to car trouble, r/s to 12/11/2016

## 2016-12-11 ENCOUNTER — Ambulatory Visit (INDEPENDENT_AMBULATORY_CARE_PROVIDER_SITE_OTHER): Payer: BLUE CROSS/BLUE SHIELD | Admitting: Specialist

## 2016-12-11 ENCOUNTER — Encounter (INDEPENDENT_AMBULATORY_CARE_PROVIDER_SITE_OTHER): Payer: Self-pay | Admitting: Specialist

## 2016-12-11 ENCOUNTER — Ambulatory Visit (INDEPENDENT_AMBULATORY_CARE_PROVIDER_SITE_OTHER): Payer: Self-pay

## 2016-12-11 VITALS — BP 141/85 | HR 101 | Ht 72.0 in | Wt 287.0 lb

## 2016-12-11 DIAGNOSIS — M5136 Other intervertebral disc degeneration, lumbar region: Secondary | ICD-10-CM | POA: Diagnosis not present

## 2016-12-11 DIAGNOSIS — M4722 Other spondylosis with radiculopathy, cervical region: Secondary | ICD-10-CM | POA: Diagnosis not present

## 2016-12-11 DIAGNOSIS — Z9889 Other specified postprocedural states: Secondary | ICD-10-CM | POA: Diagnosis not present

## 2016-12-11 NOTE — Telephone Encounter (Signed)
Patient was seen in office today.  

## 2016-12-11 NOTE — Progress Notes (Signed)
Office Visit Note   Patient: Nathan Aguilar           Date of Birth: 04-07-1971           MRN: [id number] Visit Date: 12/11/2016              Requested by: Harvie Junior, MD [address] [place], [place] [address] PCP: Harvie Junior, MD   Assessment & Plan: Visit Diagnoses:  1. History of lumbar laminectomy for spinal cord decompression   2. Other spondylosis with radiculopathy, cervical region   3. Degenerative disc disease, lumbar     Plan: Avoid frequent bending and stooping  No lifting greater than 10 lbs. May use ice or moist heat for pain. Weight loss is of benefit. Handicap license is approved.   Avoid overhead lifting and overhead use of the arms. Do not lift greater than 5 lbs. Adjust head rest in vehicle to prevent hyperextension if rear ended.  We are ordering a total myelogram and post myelogram CT scan to assess for a cause of on going upper and lower extremity weakness and numbness.  Keep your appointment with Neurology to assess for other possible causes for diffuse loss of function.   Follow-Up Instructions: No Follow-up on file.   Orders:  Orders Placed This Encounter  Procedures  . XR Lumbar Spine 2-3 Views   No orders of the defined types were placed in this encounter.     Procedures: No procedures performed   Clinical Data: No additional findings.   Subjective: Chief Complaint  Patient presents with  . Lower Back - Pain    Nathan Aguilar is here to follow up on his back pain.  He is ambulating with a cane today.  He states that he is having trouble with standing for periods of time causes his legs to get shaky and his balance gets off. He does have trembling in his legs with sitting.  The pain in his back is sharp at times and aches all the time, feels like someone is stabbing him in the back.  He states that he can't bend to put on his shoes due to his back catching. He rates his pain 8 out of 10. Has pain with coughing and  sneezing occasionally, difficulty with standing for longer periods. Unable to stand effectively. Neck pain with extension and with pain radiating into the arms and hands. Having headaches, and is taking oxycodone BID for pain from Dr. Gwyndolyn Saxon. He is seeing [facility] for eval and treatment for a right leg venous stasis ulcer post removal of a skin lesion by dermatology in 05/2016. Has significant swelling and pain relative to the swelling in his legs. Pain into the front of his legs and the bottom of his heels with standing and walking. He notes this leg start shaking When he is trying to stand and walking. No grocery shopping, niece is helping with this. Legs shake real bad and I have to sit down. Had to get tennis shoes on .     Review of Systems  HENT: Negative.   Eyes: Negative.   Respiratory: Positive for cough, shortness of breath and wheezing.   Cardiovascular: Negative.   Gastrointestinal: Negative.   Endocrine: Negative.   Genitourinary: Negative.   Musculoskeletal: Positive for back pain, gait problem, neck pain and neck stiffness.  Skin: Negative.   Allergic/Immunologic: Negative.   Neurological: Positive for dizziness, weakness, light-headedness, numbness and headaches.  Hematological: Negative.   Psychiatric/Behavioral:  Negative.      Objective: Vital Signs: BP (!) 141/85 (BP Location: Left Arm, Patient Position: Sitting)   Pulse (!) 101   Ht 6' (1.829 m)   Wt 287 lb (130.2 kg)   BMI 38.92 kg/m   Physical Exam  Constitutional: He is oriented to person, place, and time. He appears well-developed and well-nourished.  HENT:  Head: Normocephalic and atraumatic.  Eyes: EOM are normal. Pupils are equal, round, and reactive to light.  Neck: Normal range of motion. Neck supple.  Pulmonary/Chest: Effort normal and breath sounds normal.  Abdominal: Soft. Bowel sounds are normal.  Neurological: He is alert and oriented to person, place, and time.  Skin: Skin is warm  and dry.  Psychiatric: He has a normal mood and affect. His behavior is normal. Judgment and thought content normal.    Back Exam   Tenderness  The patient is experiencing tenderness in the cervical.  Range of Motion  Extension: abnormal  Flexion: abnormal  Lateral Bend Right: normal  Lateral Bend Left: normal  Rotation Right: normal  Rotation Left: normal   Muscle Strength  Right Quadriceps:  5/5  Left Quadriceps:  5/5  Right Hamstrings:  5/5  Left Hamstrings:  5/5   Tests  Straight leg raise right: positive Straight leg raise left: positive  Reflexes  Patellar: normal Achilles: normal Biceps: normal Babinski's sign: normal   Other  Toe Walk: abnormal Heel Walk: abnormal Gait: abnormal       Specialty Comments:  No specialty comments available.  Imaging: Xr Lumbar Spine 2-3 Views  Result Date: 12/11/2016 Ap and lateral flexion and extension lumbar radiographs show degenerative disc changes worst at L4-5, mild at L3-4, L2-3 and L1-2 with disc height narrowing and anterior spurs off the endplates. L5-S1 is well maintained and there is no significant abnormal motion noted at any of the lumbar segments. These findings are consistent with multiple level degenerative disc disease.     PMFS History: Patient Active Problem List   Diagnosis Date Noted  . Spinal stenosis, lumbar region, with neurogenic claudication 09/01/2016    Priority: High    Class: Acute  . CSF leak 09/01/2016    Priority: High    Class: Acute  . 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 Acute respiratory failure with hypoxia ([place]) 09/01/2016  . Uncontrolled hypertension 09/01/2016  . Suspected OSA (obstructive sleep apnea) andobesity hypoventilation 09/01/2016  . Acute pulmonary edema ([facility]) 09/01/2016  . Hypothyroidism 09/01/2016  . Anxiety and depression 09/01/2016  . HNP (herniated nucleus pulposus), cervical 10/19/2011    Class: History of  .  Carpal tunnel syndrome, left 10/19/2011    Class: Diagnosis of   Past Medical History:  Diagnosis Date  . Anxiety   . Asthma   . Basal cell carcinoma    "right hand; inside my left foot"  . Chronic bronchitis ([place])   . Chronic lower back pain    "since worker's comp accident 05/10/2016" (09/01/2016)  . Depression   . Dyspnea   . GERD (gastroesophageal reflux disease)   . 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 Heart murmur   . High cholesterol   . Hypertension   . Hypothyroidism   . Pneumonia    "once or twice" (09/01/2016)    Family History  Problem Relation Age of Onset  . Leukemia Mother   . Heart disease Father     Past Surgical History:  Procedure Laterality Date  .  ANTERIOR CERVICAL DECOMP/DISCECTOMY FUSION  10/23/2011   Procedure: ANTERIOR CERVICAL DECOMPRESSION/DISCECTOMY FUSION 1 LEVEL;  Surgeon: [name], MD;  Location: [facility];  Service: Orthopedics;  Laterality: N/A;  Anterior cervical discectomy fusion C6-7 with local bone graft, plate, screws, Allograft bone graft  . BACK SURGERY    . BASAL CELL CARCINOMA EXCISION Bilateral    "right hand; inside my left foot"  . CARPAL TUNNEL RELEASE Right   . CARPAL TUNNEL RELEASE  10/23/2011   Procedure: CARPAL TUNNEL RELEASE;  Surgeon: [name], MD;  Location: [facility];  Service: Orthopedics;  Laterality: Left;  left carpal tunnel release  . HEMILAMINOTOMY LUMBAR SPINE Bilateral 09/01/2016   partial;  L4-5/notes 09/01/2016  . KNEE ARTHROSCOPY Right    "torn cartilage"  . LUMBAR LAMINECTOMY Bilateral 09/01/2016   Procedure: BILATERAL PARTIAL HEMILAMINECTOMY L4-5;  Surgeon: [name], MD;  Location: [facility];  Service: Orthopedics;  Laterality: Bilateral;  . TONSILLECTOMY     Social History   Occupational History  . Not on file.   Social History Main Topics  . Smoking status: Current Every Day Smoker    Packs/day: 1.00    Years: 27.00    Types: Cigarettes  . Smokeless tobacco: Never Used  .  Alcohol use No  . Drug use: No  . Sexual activity: No

## 2016-12-11 NOTE — Patient Instructions (Addendum)
Avoid frequent bending and stooping  No lifting greater than 10 lbs. May use ice or moist heat for pain. Weight loss is of benefit. Handicap license is approved.  Avoid overhead lifting and overhead use of the arms. Do not lift greater than 5 lbs. Adjust head rest in vehicle to prevent hyperextension if rear ended.  We are ordering a total myelogram and post myelogram CT scan to assess for a cause of on going upper and lower extremity weakness and numbness.  Keep your appointment with Neurology to assess for other possible causes for diffuse loss of function.

## 2016-12-22 ENCOUNTER — Telehealth (INDEPENDENT_AMBULATORY_CARE_PROVIDER_SITE_OTHER): Payer: Self-pay | Admitting: Specialist

## 2016-12-22 NOTE — Telephone Encounter (Signed)
Pt requesting a note for his work for being out until further notice. He needs one for his work and one for his attorney. Pt then stated he doesn't have a job and needs this for Medicaid.    [phone]

## 2016-12-22 NOTE — Telephone Encounter (Signed)
Pt requesting a note for his work for being out until further notice. He needs one for his work and one for his attorney. Pt then stated he doesn't have a job and needs this for Medicaid.

## 2016-12-23 ENCOUNTER — Other Ambulatory Visit: Payer: Self-pay

## 2016-12-23 ENCOUNTER — Inpatient Hospital Stay: Admission: RE | Admit: 2016-12-23 | Payer: Self-pay | Source: Ambulatory Visit

## 2016-12-23 ENCOUNTER — Inpatient Hospital Stay
Admission: RE | Admit: 2016-12-23 | Discharge: 2016-12-23 | Disposition: A | Discharge status: Home / Self Care | Payer: Self-pay | Source: Ambulatory Visit | Attending: Specialist | Admitting: Specialist

## 2016-12-23 NOTE — Discharge Instructions (Signed)
Myelogram Discharge Instructions  1. Go home and rest quietly for the next 24 hours.  It is important to lie flat for the next 24 hours.  Get up only to go to the restroom.  You may lie in the bed or on a couch on your back, your stomach, your left side or your right side.  You may have one pillow under your head.  You may have pillows between your knees while you are on your side or under your knees while you are on your back.  2. DO NOT drive today.  Recline the seat as far back as it will go, while still wearing your seat belt, on the way home.  3. You may get up to go to the bathroom as needed.  You may sit up for 10 minutes to eat.  You may resume your normal diet and medications unless otherwise indicated.  Drink lots of extra fluids today and tomorrow.  4. The incidence of headache, nausea, or vomiting is about 5% (one in 20 patients).  If you develop a headache, lie flat and drink plenty of fluids until the headache goes away.  Caffeinated beverages may be helpful.  If you develop severe nausea and vomiting or a headache that does not go away with flat bed rest, call [phone].  5. You may resume normal activities after your 24 hours of bed rest is over; however, do not exert yourself strongly or do any heavy lifting tomorrow. If when you get up you have a headache when standing, go back to bed and force fluids for another 24 hours.  6. Call your physician for a follow-up appointment.  The results of your myelogram will be sent directly to your physician by the following day.  7. If you have any questions or if complications develop after you arrive home, please call [phone].  Discharge instructions have been explained to the patient.  The patient, or the person responsible for the patient, fully understands these instructions.       May resume Lexapro, Wellbutrin, Effexor and Abilify on December 24, 2016, after 10:30 am.

## 2017-01-04 ENCOUNTER — Ambulatory Visit
Admission: RE | Admit: 2017-01-04 | Discharge: 2017-01-04 | Disposition: A | Discharge status: Home / Self Care | Payer: Medicaid Other | Source: Ambulatory Visit | Attending: Specialist | Admitting: Specialist

## 2017-01-04 ENCOUNTER — Telehealth (INDEPENDENT_AMBULATORY_CARE_PROVIDER_SITE_OTHER): Payer: Self-pay | Admitting: Specialist

## 2017-01-04 VITALS — BP 141/79 | HR 79

## 2017-01-04 DIAGNOSIS — M5136 Other intervertebral disc degeneration, lumbar region: Secondary | ICD-10-CM

## 2017-01-04 DIAGNOSIS — M5020 Other cervical disc displacement, unspecified cervical region: Secondary | ICD-10-CM

## 2017-01-04 DIAGNOSIS — M48062 Spinal stenosis, lumbar region with neurogenic claudication: Secondary | ICD-10-CM

## 2017-01-04 DIAGNOSIS — Z9889 Other specified postprocedural states: Secondary | ICD-10-CM

## 2017-01-04 DIAGNOSIS — M4722 Other spondylosis with radiculopathy, cervical region: Secondary | ICD-10-CM

## 2017-01-04 DIAGNOSIS — M5416 Radiculopathy, lumbar region: Secondary | ICD-10-CM

## 2017-01-04 MED ORDER — IOPAMIDOL (ISOVUE-M 300) INJECTION 61%
10.0000 mL | Freq: Once | INTRAMUSCULAR | Status: AC | PRN
  Administered 2017-01-04: 10 mL via INTRATHECAL

## 2017-01-04 MED ORDER — ONDANSETRON HCL 4 MG/2ML IJ SOLN: 4.0000 mg | Freq: Four times a day (QID) | INTRAMUSCULAR | Status: DC | PRN

## 2017-01-04 MED ORDER — DIAZEPAM 5 MG PO TABS
10.0000 mg | ORAL_TABLET | Freq: Once | ORAL | Status: AC
  Administered 2017-01-04: 10 mg via ORAL

## 2017-01-04 NOTE — Telephone Encounter (Signed)
Pt called again regarding previous message about a work note, he needs to know what to do.

## 2017-01-04 NOTE — Progress Notes (Signed)
Pt states he has been off Abilify, Effexor, Wellbutrin and Lexapro for the past 2 days.

## 2017-01-04 NOTE — Discharge Instructions (Signed)
Myelogram Discharge Instructions  1. Go home and rest quietly for the next 24 hours.  It is important to lie flat for the next 24 hours.  Get up only to go to the restroom.  You may lie in the bed or on a couch on your back, your stomach, your left side or your right side.  You may have one pillow under your head.  You may have pillows between your knees while you are on your side or under your knees while you are on your back.  2. DO NOT drive today.  Recline the seat as far back as it will go, while still wearing your seat belt, on the way home.  3. You may get up to go to the bathroom as needed.  You may sit up for 10 minutes to eat.  You may resume your normal diet and medications unless otherwise indicated.  Drink lots of extra fluids today and tomorrow.  4. The incidence of headache, nausea, or vomiting is about 5% (one in 20 patients).  If you develop a headache, lie flat and drink plenty of fluids until the headache goes away.  Caffeinated beverages may be helpful.  If you develop severe nausea and vomiting or a headache that does not go away with flat bed rest, call [phone].  5. You may resume normal activities after your 24 hours of bed rest is over; however, do not exert yourself strongly or do any heavy lifting tomorrow. If when you get up you have a headache when standing, go back to bed and force fluids for another 24 hours.  6. Call your physician for a follow-up appointment.  The results of your myelogram will be sent directly to your physician by the following day.  7. If you have any questions or if complications develop after you arrive home, please call [phone].  Discharge instructions have been explained to the patient.  The patient, or the person responsible for the patient, fully understands these instructions.      May resume Lexapro, Abilify, Wellbutrin and Effexor on January 05, 2017, after 10:00 am.

## 2017-01-05 ENCOUNTER — Other Ambulatory Visit (INDEPENDENT_AMBULATORY_CARE_PROVIDER_SITE_OTHER): Payer: Self-pay | Admitting: Specialist

## 2017-01-12 ENCOUNTER — Telehealth (INDEPENDENT_AMBULATORY_CARE_PROVIDER_SITE_OTHER): Payer: Self-pay | Admitting: Specialist

## 2017-01-12 NOTE — Telephone Encounter (Signed)
Patient called needing a note written by Dr Louanne Skye for his Long term disability and Social Services. Patient said he left a message on 12-22-16 and 01-04-17. Patient asked for a call back as soon as possible because per patient  this is an emergency.The number to contact patient is [phone]

## 2017-01-12 NOTE — Telephone Encounter (Signed)
Sent another message to Dr. Louanne Skye today and previous message sent on 12/22/2016

## 2017-01-12 NOTE — Telephone Encounter (Signed)
Patient called needing a note written by Dr Louanne Skye for his Long term disability and Social Services. Patient said he left a message on 12-22-16 and 01-04-17. Patient asked for a call back as soon as possible because per patient  this is an emergency-----I have sent a message previously about this on this patient.

## 2017-01-18 ENCOUNTER — Telehealth (INDEPENDENT_AMBULATORY_CARE_PROVIDER_SITE_OTHER): Payer: Self-pay | Admitting: Radiology

## 2017-01-18 ENCOUNTER — Telehealth (INDEPENDENT_AMBULATORY_CARE_PROVIDER_SITE_OTHER): Payer: Self-pay | Admitting: Specialist

## 2017-01-18 NOTE — Telephone Encounter (Signed)
Patient states he has called 2x today to check the status of a disability note, I called Alyse Low she said she spoke with Dr.Nitka already and she would contact patient when note was ready.

## 2017-01-18 NOTE — Telephone Encounter (Signed)
Patient came by Friday about his letters, I advised that Dr. Louanne Skye was in surgery and that it was an all day case.  And that Dr. Louanne Skye would not be back in the office till Monday afternoon.    Patient called this morning regarding his notes for social security disability and for his attorney.  I had advised then that Dr. Louanne Skye was in surgery this morning and that he would not be back in until this afternoon.  Now patient has called again about his letters.  I did speak with Dr. Louanne Skye abut this patient when he got to the office this afternoon.  I advised Dusty, to let him know that Dr. Louanne Skye had just walked in and that he was aware of this and that as soon as it was done that I would call him.

## 2017-01-19 ENCOUNTER — Encounter (INDEPENDENT_AMBULATORY_CARE_PROVIDER_SITE_OTHER): Payer: Self-pay | Admitting: Specialist

## 2017-01-22 NOTE — Progress Notes (Signed)
Total myelogram completed. jen

## 2017-01-25 ENCOUNTER — Ambulatory Visit (INDEPENDENT_AMBULATORY_CARE_PROVIDER_SITE_OTHER): Payer: Self-pay | Admitting: Neurology

## 2017-01-25 ENCOUNTER — Encounter: Payer: Self-pay | Admitting: Neurology

## 2017-01-25 VITALS — BP 142/82 | HR 86 | Ht 72.0 in | Wt 306.8 lb

## 2017-01-25 DIAGNOSIS — R200 Anesthesia of skin: Secondary | ICD-10-CM

## 2017-01-25 DIAGNOSIS — R202 Paresthesia of skin: Secondary | ICD-10-CM

## 2017-01-25 DIAGNOSIS — M542 Cervicalgia: Secondary | ICD-10-CM

## 2017-01-25 DIAGNOSIS — Z720 Tobacco use: Secondary | ICD-10-CM

## 2017-01-25 NOTE — Patient Instructions (Signed)
My recommendation is to focus on pain control.  Consider epidural injection as well.  Eventually, you will need to undergo more physical therapy.  I want to review the nerve study and will contact you with further recommendations, if any
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
## 2017-01-28 ENCOUNTER — Encounter (INDEPENDENT_AMBULATORY_CARE_PROVIDER_SITE_OTHER): Payer: Self-pay | Admitting: Specialist

## 2017-01-28 ENCOUNTER — Ambulatory Visit (INDEPENDENT_AMBULATORY_CARE_PROVIDER_SITE_OTHER): Payer: Self-pay | Admitting: Specialist

## 2017-01-28 VITALS — BP 149/87 | HR 112 | Ht 72.0 in | Wt 294.0 lb

## 2017-01-28 DIAGNOSIS — R202 Paresthesia of skin: Secondary | ICD-10-CM

## 2017-01-28 DIAGNOSIS — R200 Anesthesia of skin: Secondary | ICD-10-CM

## 2017-01-28 DIAGNOSIS — R531 Weakness: Secondary | ICD-10-CM

## 2017-01-28 MED ORDER — ACETAMINOPHEN ER 650 MG PO TBCR: 650.0000 mg | EXTENDED_RELEASE_TABLET | Freq: Three times a day (TID) | ORAL | 3 refills | Status: DC | PRN
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
## 2017-03-09 ENCOUNTER — Ambulatory Visit (INDEPENDENT_AMBULATORY_CARE_PROVIDER_SITE_OTHER): Payer: Self-pay | Admitting: Orthopaedic Surgery

## 2017-03-15 ENCOUNTER — Ambulatory Visit (INDEPENDENT_AMBULATORY_CARE_PROVIDER_SITE_OTHER): Payer: Self-pay | Admitting: Orthopaedic Surgery

## 2017-04-09 ENCOUNTER — Encounter (HOSPITAL_COMMUNITY): Payer: Self-pay | Admitting: Emergency Medicine

## 2017-04-09 ENCOUNTER — Emergency Department (HOSPITAL_COMMUNITY): Payer: BLUE CROSS/BLUE SHIELD

## 2017-04-09 ENCOUNTER — Emergency Department (HOSPITAL_COMMUNITY)
Admission: EM | Admit: 2017-04-09 | Discharge: 2017-04-09 | Disposition: A | Discharge status: Home / Self Care | Payer: BLUE CROSS/BLUE SHIELD | Attending: Emergency Medicine | Admitting: Emergency Medicine

## 2017-04-09 DIAGNOSIS — E7800 Pure hypercholesterolemia, unspecified: Secondary | ICD-10-CM | POA: Insufficient documentation

## 2017-04-09 DIAGNOSIS — Z79899 Other long term (current) drug therapy: Secondary | ICD-10-CM | POA: Insufficient documentation

## 2017-04-09 DIAGNOSIS — Y998 Other external cause status: Secondary | ICD-10-CM | POA: Diagnosis not present

## 2017-04-09 DIAGNOSIS — Y92410 Unspecified street and highway as the place of occurrence of the external cause: Secondary | ICD-10-CM | POA: Diagnosis not present

## 2017-04-09 DIAGNOSIS — S0990XA Unspecified injury of head, initial encounter: Secondary | ICD-10-CM | POA: Diagnosis not present

## 2017-04-09 DIAGNOSIS — J45909 Unspecified asthma, uncomplicated: Secondary | ICD-10-CM | POA: Diagnosis not present

## 2017-04-09 DIAGNOSIS — S3982XA Other specified injuries of lower back, initial encounter: Secondary | ICD-10-CM | POA: Insufficient documentation

## 2017-04-09 DIAGNOSIS — E039 Hypothyroidism, unspecified: Secondary | ICD-10-CM | POA: Diagnosis not present

## 2017-04-09 DIAGNOSIS — S61215A Laceration without foreign body of left ring finger without damage to nail, initial encounter: Secondary | ICD-10-CM | POA: Diagnosis not present

## 2017-04-09 DIAGNOSIS — Z85828 Personal history of other malignant neoplasm of skin: Secondary | ICD-10-CM | POA: Insufficient documentation

## 2017-04-09 DIAGNOSIS — I129 Hypertensive chronic kidney disease with stage 1 through stage 4 chronic kidney disease, or unspecified chronic kidney disease: Secondary | ICD-10-CM | POA: Diagnosis not present

## 2017-04-09 DIAGNOSIS — F17210 Nicotine dependence, cigarettes, uncomplicated: Secondary | ICD-10-CM | POA: Diagnosis not present

## 2017-04-09 DIAGNOSIS — N183 Chronic kidney disease, stage 3 (moderate): Secondary | ICD-10-CM | POA: Diagnosis not present

## 2017-04-09 DIAGNOSIS — S2243XA Multiple fractures of ribs, bilateral, initial encounter for closed fracture: Secondary | ICD-10-CM | POA: Insufficient documentation

## 2017-04-09 DIAGNOSIS — Y939 Activity, unspecified: Secondary | ICD-10-CM | POA: Insufficient documentation

## 2017-04-09 DIAGNOSIS — S2222XA Fracture of body of sternum, initial encounter for closed fracture: Secondary | ICD-10-CM | POA: Insufficient documentation

## 2017-04-09 DIAGNOSIS — S299XXA Unspecified injury of thorax, initial encounter: Secondary | ICD-10-CM | POA: Diagnosis present

## 2017-04-09 LAB — I-STAT CHEM 8, ED
BUN: 12 mg/dL (ref 6–20)
CALCIUM ION: 1.11 mmol/L — AB (ref 1.15–1.40)
Chloride: 106 mmol/L (ref 101–111)
Creatinine, Ser: 0.8 mg/dL (ref 0.61–1.24)
Glucose, Bld: 152 mg/dL — ABNORMAL HIGH (ref 65–99)
HEMATOCRIT: 44 % (ref 39.0–52.0)
HEMOGLOBIN: 15 g/dL (ref 13.0–17.0)
Potassium: 3.8 mmol/L (ref 3.5–5.1)
SODIUM: 140 mmol/L (ref 135–145)
TCO2: 25 mmol/L (ref 0–100)

## 2017-04-09 LAB — COMPREHENSIVE METABOLIC PANEL
ALBUMIN: 3.6 g/dL (ref 3.5–5.0)
ALT: 62 U/L (ref 17–63)
ANION GAP: 9 (ref 5–15)
AST: 107 U/L — AB (ref 15–41)
Alkaline Phosphatase: 89 U/L (ref 38–126)
BUN: 10 mg/dL (ref 6–20)
CHLORIDE: 105 mmol/L (ref 101–111)
CO2: 21 mmol/L — ABNORMAL LOW (ref 22–32)
Calcium: 9.2 mg/dL (ref 8.9–10.3)
Creatinine, Ser: 0.87 mg/dL (ref 0.61–1.24)
GFR calc Af Amer: >60 mL/min (ref >60)
GLUCOSE: 148 mg/dL — AB (ref 65–99)
POTASSIUM: 3.9 mmol/L (ref 3.5–5.1)
Sodium: 135 mmol/L (ref 135–145)
TOTAL PROTEIN: 6.7 g/dL (ref 6.5–8.1)
Total Bilirubin: 0.4 mg/dL (ref 0.3–1.2)

## 2017-04-09 LAB — PROTIME-INR
INR: 1.06
PROTHROMBIN TIME: 13.8 s (ref 11.4–15.2)

## 2017-04-09 LAB — CBC
HEMATOCRIT: 42.9 % (ref 39.0–52.0)
HEMOGLOBIN: 14.3 g/dL (ref 13.0–17.0)
MCH: 29.9 pg (ref 26.0–34.0)
MCHC: 33.3 g/dL (ref 30.0–36.0)
MCV: 89.6 fL (ref 78.0–100.0)
Platelets: 257 10*3/uL (ref 150–400)
RBC: 4.79 MIL/uL (ref 4.22–5.81)
RDW: 14.1 % (ref 11.5–15.5)
WBC: 14.8 10*3/uL — AB (ref 4.0–10.5)

## 2017-04-09 LAB — ETHANOL: Alcohol, Ethyl (B): <5 mg/dL (ref <5)

## 2017-04-09 LAB — I-STAT TROPONIN, ED: TROPONIN I, POC: 0 ng/mL (ref 0.00–0.08)

## 2017-04-09 LAB — I-STAT CG4 LACTIC ACID, ED: LACTIC ACID, VENOUS: 2.18 mmol/L — AB (ref 0.5–1.9)

## 2017-04-09 LAB — CDS SEROLOGY

## 2017-04-09 MED ORDER — HYDROMORPHONE HCL 1 MG/ML IJ SOLN: 1.0000 mg | Freq: Once | INTRAMUSCULAR | Status: DC

## 2017-04-09 MED ORDER — ONDANSETRON HCL 4 MG/2ML IJ SOLN
4.0000 mg | Freq: Once | INTRAMUSCULAR | Status: AC
  Administered 2017-04-09: 4 mg via INTRAVENOUS
  Filled 2017-04-09: qty 2

## 2017-04-09 MED ORDER — IOPAMIDOL (ISOVUE-300) INJECTION 61%
INTRAVENOUS | Status: AC
  Administered 2017-04-09: 100 mL
  Filled 2017-04-09: qty 100

## 2017-04-09 MED ORDER — LIDOCAINE-EPINEPHRINE 2 %-1:100000 IJ SOLN
20.0000 mL | Freq: Once | INTRAMUSCULAR | Status: AC
  Administered 2017-04-09: 1 mL
  Filled 2017-04-09: qty 20

## 2017-04-09 MED ORDER — MORPHINE SULFATE 15 MG PO TABS: 15.0000 mg | ORAL_TABLET | ORAL | 0 refills | Status: DC | PRN

## 2017-04-09 MED ORDER — HYDROMORPHONE HCL 1 MG/ML IJ SOLN
1.0000 mg | Freq: Once | INTRAMUSCULAR | Status: AC
Start: 2017-04-09 — End: 2017-04-09
  Administered 2017-04-09: 1 mg via INTRAMUSCULAR
  Filled 2017-04-09: qty 1

## 2017-04-09 MED ORDER — MORPHINE SULFATE (PF) 4 MG/ML IV SOLN
4.0000 mg | Freq: Once | INTRAVENOUS | Status: AC
  Administered 2017-04-09: 4 mg via INTRAVENOUS
  Filled 2017-04-09: qty 1
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
## 2017-04-09 NOTE — ED Notes (Signed)
I-stat lactic acid result given to Dr. Laverta Baltimore

## 2017-04-09 NOTE — Discharge Instructions (Signed)
Return for passing out, sudden worsening pain, sob, fever.  Use the inhaler every hour for 10 min.

## 2017-04-09 NOTE — ED Notes (Signed)
Per nurse she will cancel blood bank sample.
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
## 2017-05-04 ENCOUNTER — Ambulatory Visit (INDEPENDENT_AMBULATORY_CARE_PROVIDER_SITE_OTHER): Payer: Self-pay

## 2017-05-04 ENCOUNTER — Ambulatory Visit (INDEPENDENT_AMBULATORY_CARE_PROVIDER_SITE_OTHER): Payer: BLUE CROSS/BLUE SHIELD | Admitting: Orthopaedic Surgery

## 2017-05-04 DIAGNOSIS — M25532 Pain in left wrist: Secondary | ICD-10-CM | POA: Diagnosis not present

## 2017-05-04 DIAGNOSIS — M25531 Pain in right wrist: Secondary | ICD-10-CM | POA: Diagnosis not present
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
## 2017-05-20 ENCOUNTER — Ambulatory Visit (INDEPENDENT_AMBULATORY_CARE_PROVIDER_SITE_OTHER): Payer: BLUE CROSS/BLUE SHIELD | Admitting: Orthopaedic Surgery

## 2017-05-21 ENCOUNTER — Other Ambulatory Visit: Payer: Self-pay | Admitting: Neurosurgery

## 2017-05-21 DIAGNOSIS — M545 Low back pain, unspecified: Secondary | ICD-10-CM

## 2017-05-21 DIAGNOSIS — G8929 Other chronic pain: Secondary | ICD-10-CM

## 2017-05-21 DIAGNOSIS — M542 Cervicalgia: Secondary | ICD-10-CM

## 2017-05-24 ENCOUNTER — Ambulatory Visit (INDEPENDENT_AMBULATORY_CARE_PROVIDER_SITE_OTHER): Payer: Medicaid Other | Admitting: Orthopaedic Surgery

## 2017-05-25 ENCOUNTER — Ambulatory Visit (INDEPENDENT_AMBULATORY_CARE_PROVIDER_SITE_OTHER): Payer: BLUE CROSS/BLUE SHIELD | Admitting: Orthopaedic Surgery

## 2017-06-02 ENCOUNTER — Ambulatory Visit (INDEPENDENT_AMBULATORY_CARE_PROVIDER_SITE_OTHER): Payer: BLUE CROSS/BLUE SHIELD | Admitting: Orthopedic Surgery

## 2017-06-02 ENCOUNTER — Encounter (INDEPENDENT_AMBULATORY_CARE_PROVIDER_SITE_OTHER): Payer: Self-pay | Admitting: Orthopedic Surgery

## 2017-06-02 ENCOUNTER — Encounter (INDEPENDENT_AMBULATORY_CARE_PROVIDER_SITE_OTHER): Payer: Self-pay

## 2017-06-02 VITALS — BP 121/80 | HR 123 | Resp 14 | Ht 72.0 in | Wt 294.0 lb

## 2017-06-02 DIAGNOSIS — R69 Illness, unspecified: Secondary | ICD-10-CM
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
## 2017-06-09 ENCOUNTER — Other Ambulatory Visit: Payer: Self-pay

## 2017-06-09 ENCOUNTER — Ambulatory Visit
Admission: RE | Admit: 2017-06-09 | Discharge: 2017-06-09 | Disposition: A | Discharge status: Home / Self Care | Payer: BLUE CROSS/BLUE SHIELD | Source: Ambulatory Visit | Attending: Neurosurgery | Admitting: Neurosurgery

## 2017-06-09 DIAGNOSIS — G8929 Other chronic pain: Secondary | ICD-10-CM

## 2017-06-09 DIAGNOSIS — M542 Cervicalgia: Principal | ICD-10-CM

## 2017-06-09 DIAGNOSIS — M545 Low back pain: Principal | ICD-10-CM

## 2017-11-21 IMAGING — CR DG LUMBAR SPINE 2-3V
2 series · 2 of 2 positions shown · non-contrast
Comparison: Lumbar MRI May 27, 2016

CLINICAL DATA: L4-5 laminectomy

EXAM:
LUMBAR SPINE - 2-3 VIEW

[xtable lateral (1 of 2)]
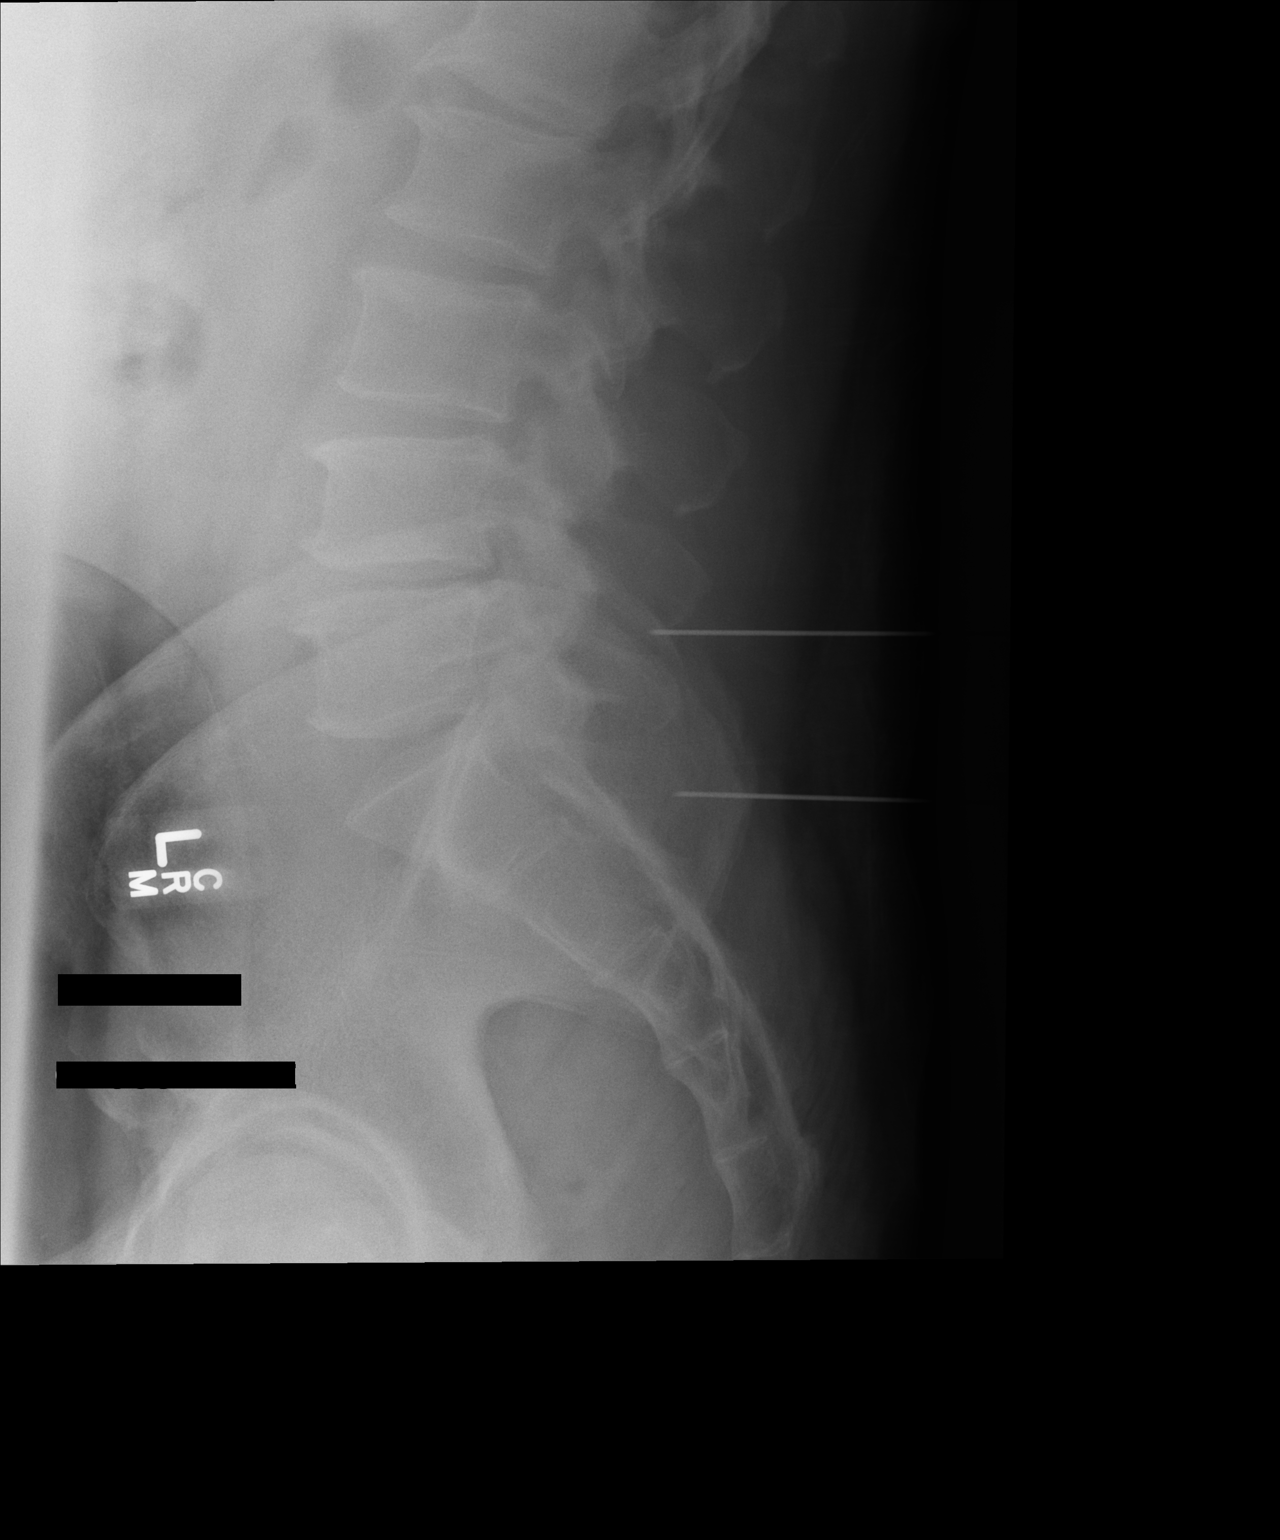

[xtable lateral (2 of 2)]
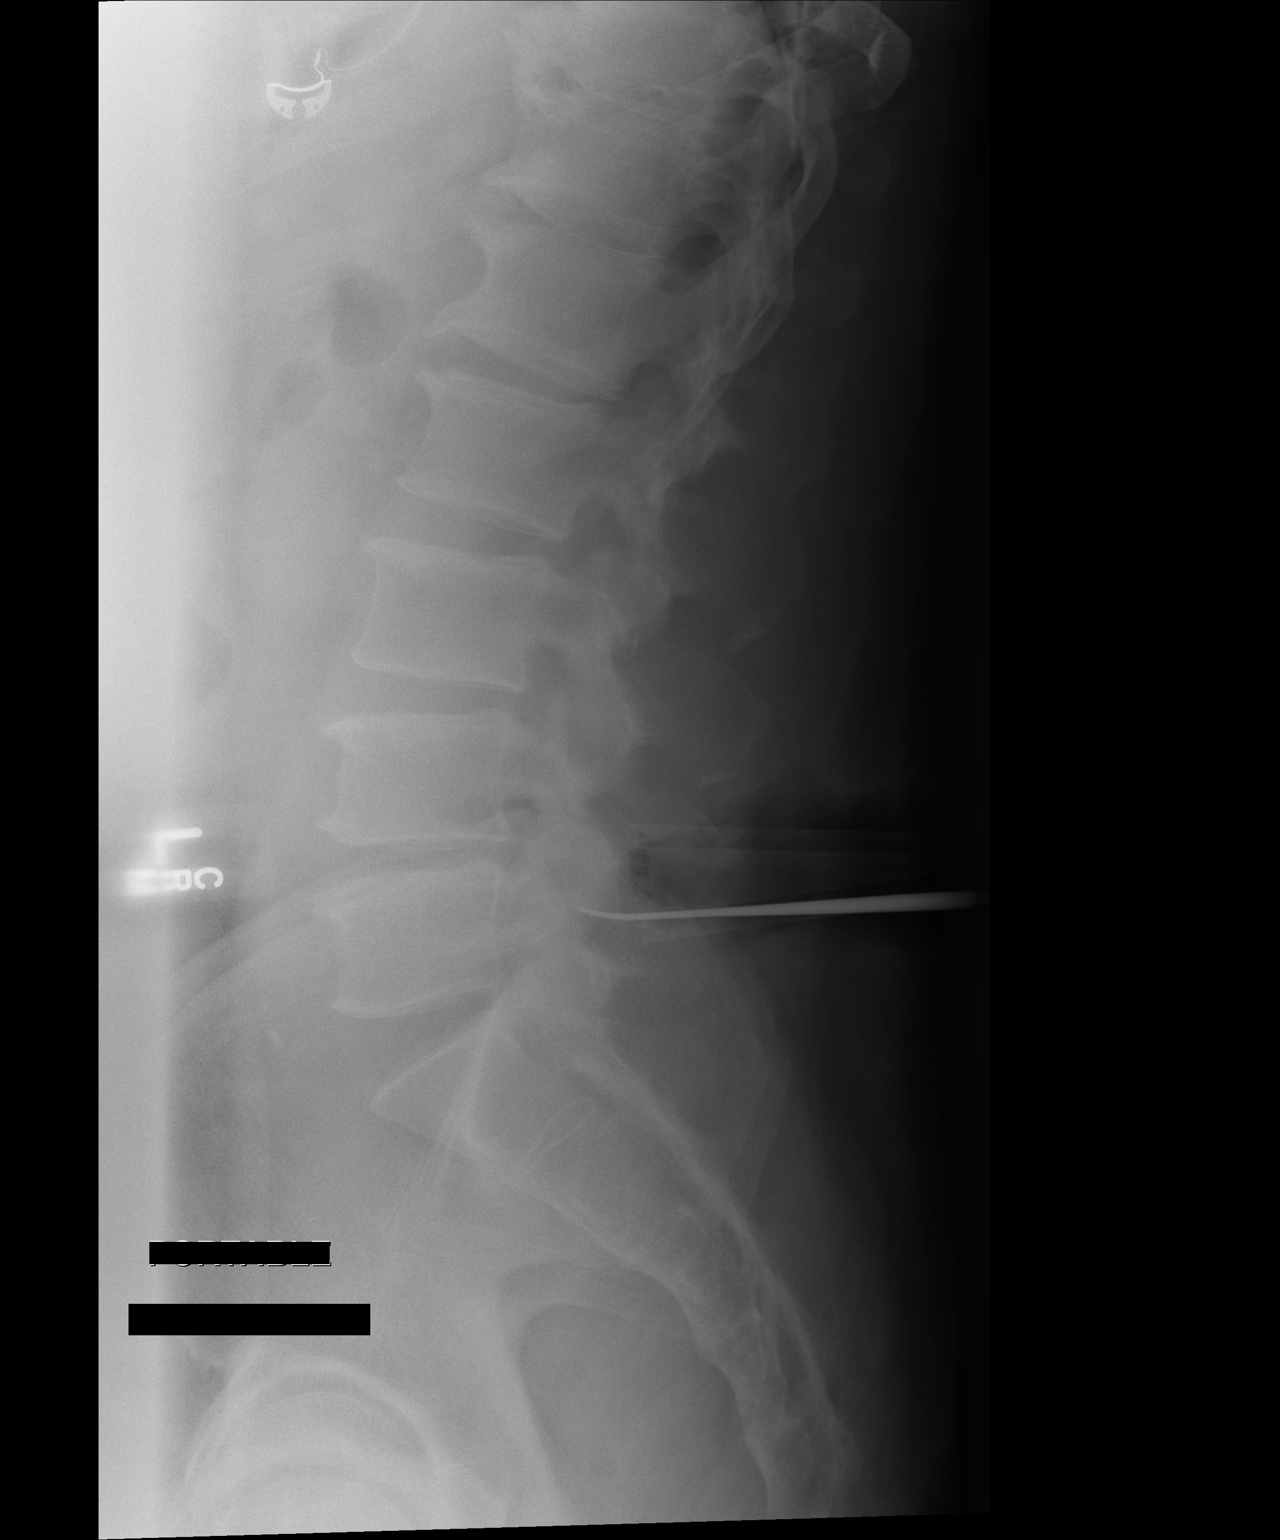

[2 of 2 positions shown; findings below may reference images not displayed]

FINDINGS: Cross-table lateral lumbar image time stamped [DATE]: 56 submitted.
Metallic probe tips are posterior to the midportion of the L5
vertebral body and posterior to the S1-2 interspace respectively.
There is moderate disc space narrowing at L4-5. On the second
submitted cross-table lateral image time stamped [DATE], metallic
probe tip is posterior to the midportion of the L5 vertebral body.
Cutting tool overlies the L5 spinous process. There is moderate disc
space narrowing at L4-5. No fracture or spondylolisthesis.
IMPRESSION: On the final submitted cross-table lateral image, metallic probe tip
is posterior to the midportion of the L5 vertebral body. Cutting
tool overlies the L5 spinous process. No fracture or
spondylolisthesis. Moderate disc space narrowing is noted at L4-5.

## 2017-11-21 IMAGING — CR DG CHEST 1V PORT
1 series · 1 of 1 positions shown · non-contrast
Comparison: 08/13/2016

CLINICAL DATA: Hypoxemia

EXAM:
PORTABLE CHEST 1 VIEW

[AP]
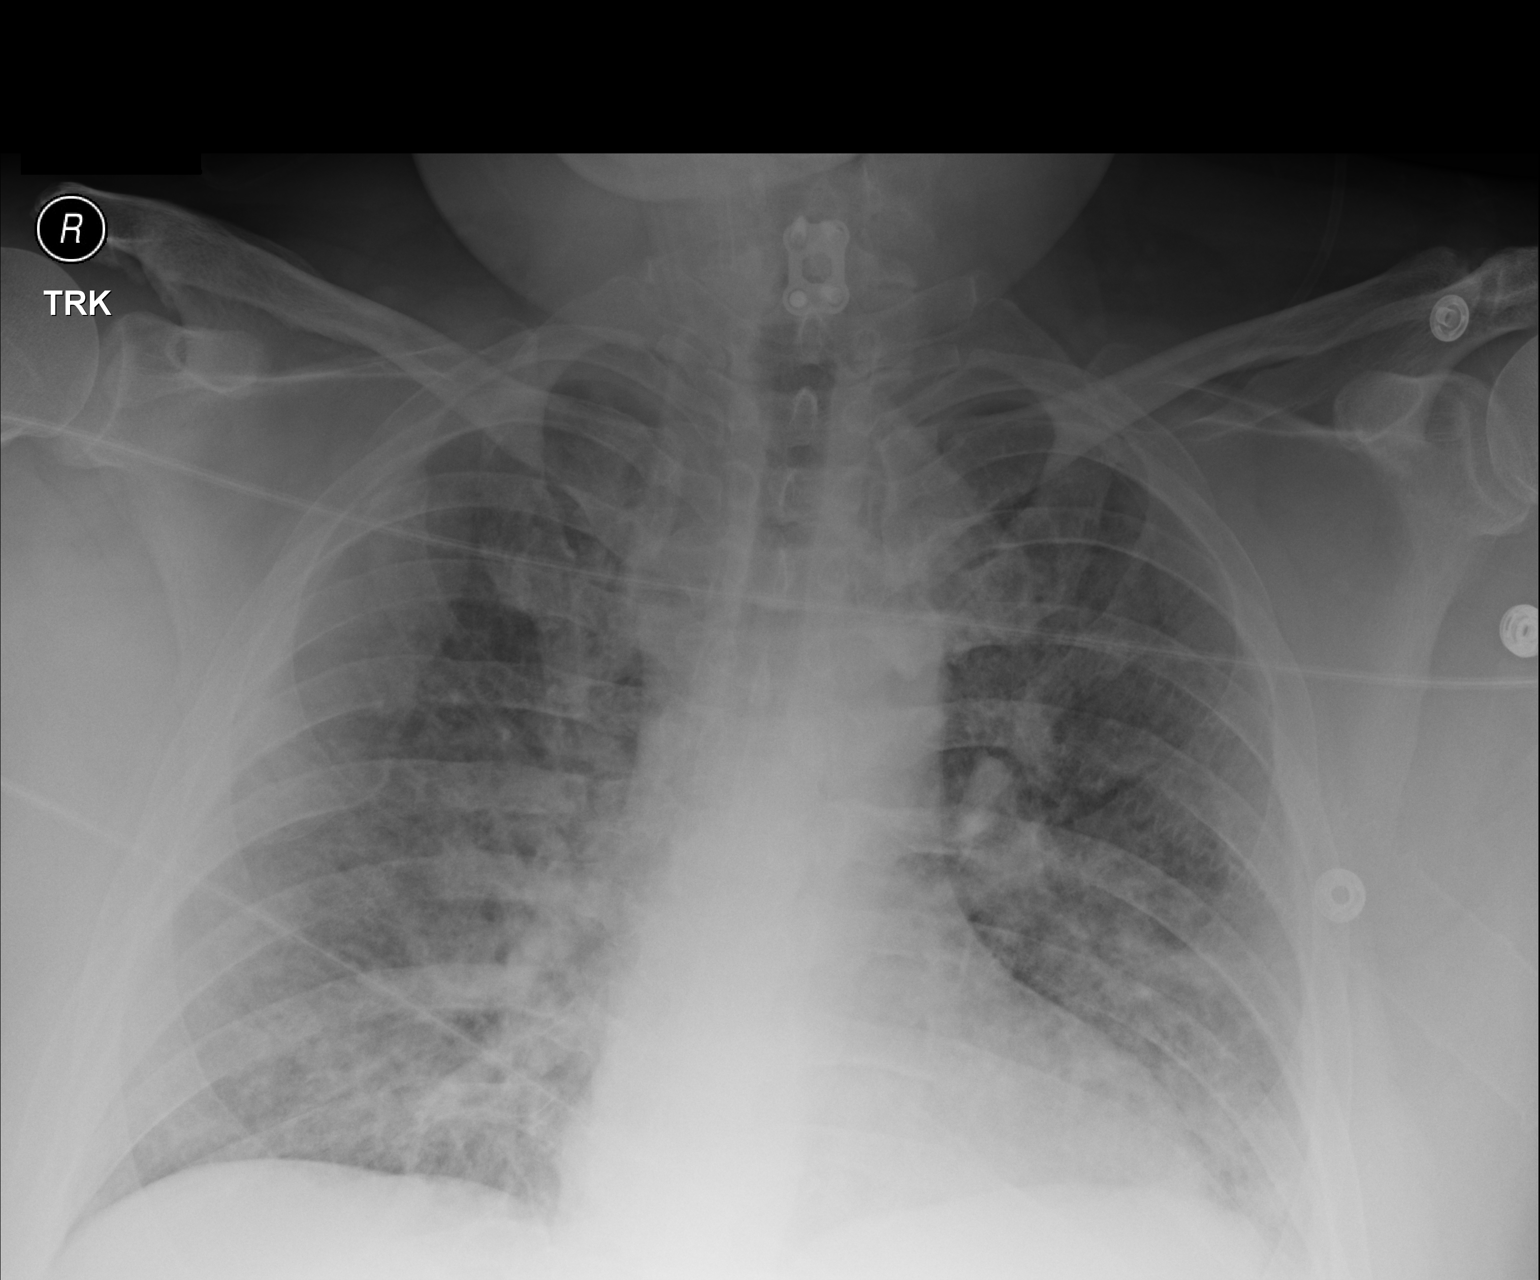

[1 of 1 positions shown; findings below may reference images not displayed]

FINDINGS: Cardiomediastinal silhouette is unremarkable. There is mild
interstitial prominence bilaterally. Mild edema or pneumonitis
cannot be excluded. No segmental infiltrate. No pneumothorax.
IMPRESSION: Mild interstitial prominence bilaterally. Mild edema or pneumonitis
cannot be excluded. No segmental infiltrate. No pneumothorax.

## 2017-11-22 IMAGING — CR DG CHEST 1V PORT
1 series · 1 of 1 positions shown · non-contrast
Comparison: 08/01/2016.

CLINICAL DATA: Shortness of breath.

EXAM:
PORTABLE CHEST 1 VIEW

[AP]
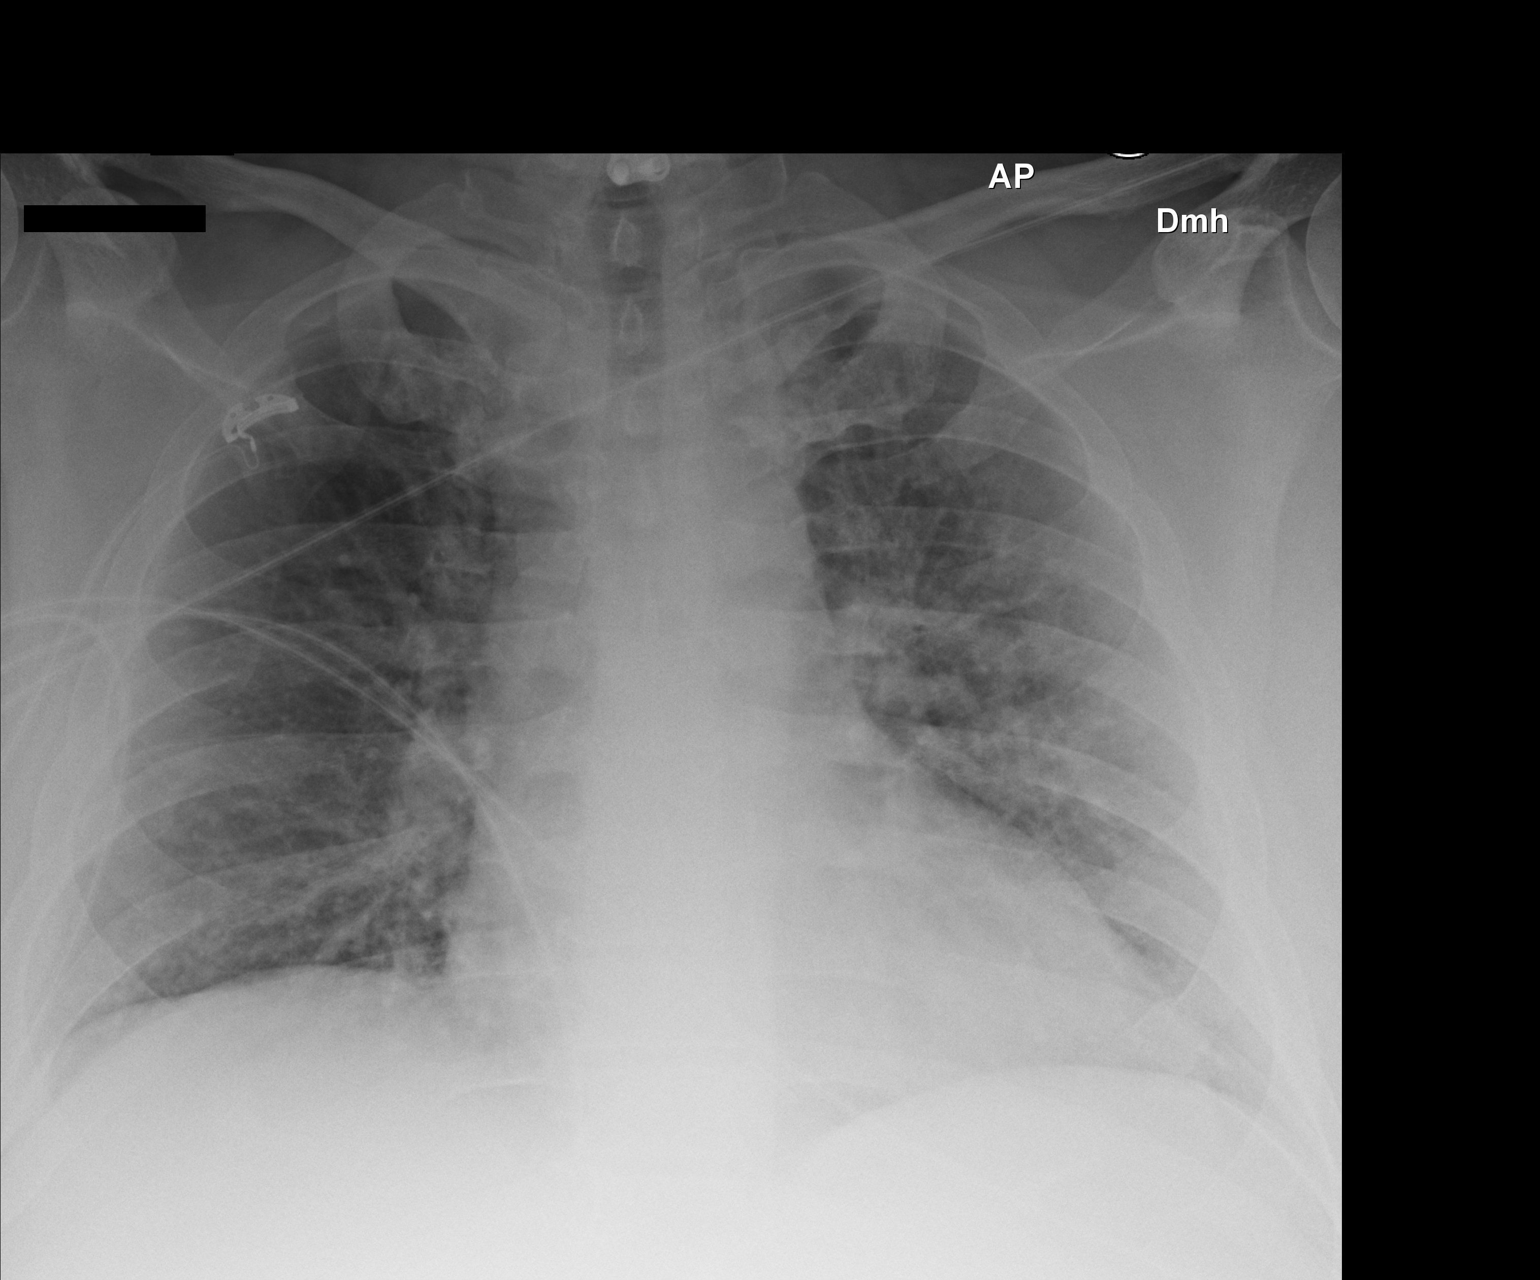

[1 of 1 positions shown; findings below may reference images not displayed]

FINDINGS: Cardiomegaly with with bilateral from interstitial prominence
consistent congestive heart failure. Interval partial clearing from
prior exam. No prominent pleural effusion. No pneumothorax. Prior
cervical spine fusion .
IMPRESSION: Congestive heart failure with pulmonary interstitial edema. Interval
partial clearing from prior exam .
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
## 2019-05-12 ENCOUNTER — Other Ambulatory Visit: Payer: Self-pay | Admitting: Neurosurgery

## 2019-05-12 DIAGNOSIS — G8929 Other chronic pain: Secondary | ICD-10-CM

## 2019-05-12 DIAGNOSIS — M545 Low back pain, unspecified: Secondary | ICD-10-CM

## 2019-06-09 ENCOUNTER — Other Ambulatory Visit: Payer: BLUE CROSS/BLUE SHIELD

## 2019-06-10 ENCOUNTER — Other Ambulatory Visit: Payer: Self-pay

## 2019-06-10 ENCOUNTER — Ambulatory Visit
Admission: RE | Admit: 2019-06-10 | Discharge: 2019-06-10 | Disposition: A | Discharge status: Home / Self Care | Payer: Medicare Other | Source: Ambulatory Visit | Attending: Neurosurgery | Admitting: Neurosurgery

## 2019-06-10 DIAGNOSIS — G8929 Other chronic pain: Secondary | ICD-10-CM

## 2019-06-10 DIAGNOSIS — M545 Low back pain, unspecified: Secondary | ICD-10-CM

## 2019-06-10 MED ORDER — GADOBENATE DIMEGLUMINE 529 MG/ML IV SOLN
20.0000 mL | Freq: Once | INTRAVENOUS | Status: AC | PRN
  Administered 2019-06-10: 20 mL via INTRAVENOUS

## 2019-06-16 DIAGNOSIS — Z01818 Encounter for other preprocedural examination: Secondary | ICD-10-CM

## 2019-07-28 ENCOUNTER — Other Ambulatory Visit: Payer: Self-pay | Admitting: Neurosurgery

## 2019-07-31 ENCOUNTER — Other Ambulatory Visit: Payer: Self-pay | Admitting: Neurosurgery

## 2019-08-04 NOTE — Pre-Procedure Instructions (Signed)
[place], [place] - [address] [place] [address] Phone: [phone] Fax: [phone]  [facility] [place], [place] [address] [place] [address] Phone: [phone] Fax: [phone]      Your procedure is scheduled on 08-10-19  Report to [facility] Main Entrance "A" at 1030A.M., and check in at the Admitting office.  Call this number if you have problems the morning of surgery:  [phone]  Call [phone] if you have any questions prior to your surgery date Monday-Friday 8am-4pm    Remember:  Do not eat or drink after midnight the night before your surgery  Take these medicines the morning of surgery with A SIP OF WATER : citalopram (CELEXA) ezetimibe (ZETIA) BREO ELLIPTA  levothyroxine (SYNTHROID, LEVOTHROID) omeprazole (PRILOSEC) albuterol (PROAIR HFA) ALPRAZolam (XANAX)as needed  Follow your surgeon's instructions on when to stop Aspirin.  If no instructions were given by your surgeon then you will need to call the office to get those instructions.   7 days prior to surgery STOP taking any Aspirin (unless otherwise instructed by your surgeon), CELEBREX, MOBIC,Aleve, Naproxen, Ibuprofen, Motrin, Advil, Goody's, BC's, all herbal medications, fish oil, and all vitamins.   WHAT DO I DO ABOUT MY DIABETES MEDICATION?   Marland Kitchen Do not take oral diabetes medicines (pills) including METFORMIN the morning of surgery.  How to Manage Your Diabetes Before and After Surgery  Why is it important to control my blood sugar before and after surgery? . Improving blood sugar levels before and after surgery helps healing and can limit problems. . A way of improving blood sugar control is eating a healthy diet by: o  Eating less sugar and carbohydrates o  Increasing activity/exercise o  Talking with your doctor about reaching your blood sugar goals . High blood  sugars (greater than 180 mg/dL) can raise your risk of infections and slow your recovery, so you will need to focus on controlling your diabetes during the weeks before surgery. . Make sure that the doctor who takes care of your diabetes knows about your planned surgery including the date and location.  How do I manage my blood sugar before surgery? . Check your blood sugar at least 4 times a day, starting 2 days before surgery, to make sure that the level is not too high or low. o Check your blood sugar the morning of your surgery when you wake up and every 2 hours until you get to the Short Stay unit. . If your blood sugar is less than 70 mg/dL, you will need to treat for low blood sugar: o Do not take insulin. o Treat a low blood sugar (less than 70 mg/dL) with  cup of clear juice (cranberry or apple), 4 glucose tablets, OR glucose gel. o Recheck blood sugar in 15 minutes after treatment (to make sure it is greater than 70 mg/dL). If your blood sugar is not greater than 70 mg/dL on recheck, call [phone] for further instructions. . Report your blood sugar to the short stay nurse when you get to Short Stay.  . If you are admitted to the hospital after surgery: o Your blood sugar will be checked by the staff and you will probably be given insulin after surgery (instead of oral diabetes medicines) to make sure you have good blood sugar levels. o The goal for blood sugar control after surgery is 80-180  mg/dL.   The Morning of Surgery  Do not wear jewelry, make-up or nail polish.  Do not wear lotions, powders, or perfumes/colognes, or deodorant  Do not shave 48 hours prior to surgery.  Men may shave face and neck.  Do not bring valuables to the hospital.  [facility] is not responsible for any belongings or valuables.  If you are a smoker, DO NOT Smoke 24 hours prior to surgery IF you wear a CPAP at night please bring your mask, tubing, and machine the morning of surgery   Remember that  you must have someone to transport you home after your surgery, and remain with you for 24 hours if you are discharged the same day.   Contacts, glasses, hearing aids, dentures or bridgework may not be worn into surgery.    Leave your suitcase in the car.  After surgery it may be brought to your room.  For patients admitted to the hospital, discharge time will be determined by your treatment team.  Patients discharged the day of surgery will not be allowed to drive home.    Special instructions:   [facility]- Preparing For Surgery  Before surgery, you can play an important role. Because skin is not sterile, your skin needs to be as free of germs as possible. You can reduce the number of germs on your skin by washing with CHG (chlorahexidine gluconate) Soap before surgery.  CHG is an antiseptic cleaner which kills germs and bonds with the skin to continue killing germs even after washing.    Oral Hygiene is also important to reduce your risk of infection.  Remember - BRUSH YOUR TEETH THE MORNING OF SURGERY WITH YOUR REGULAR TOOTHPASTE  Please do not use if you have an allergy to CHG or antibacterial soaps. If your skin becomes reddened/irritated stop using the CHG.  Do not shave (including legs and underarms) for at least 48 hours prior to first CHG shower. It is OK to shave your face.  Please follow these instructions carefully.   1. Shower the NIGHT BEFORE SURGERY and the MORNING OF SURGERY with CHG Soap.   2. If you chose to wash your hair, wash your hair first as usual with your normal shampoo.  3. After you shampoo, rinse your hair and body thoroughly to remove the shampoo.  4. Use CHG as you would any other liquid soap. You can apply CHG directly to the skin and wash gently with a scrungie or a clean washcloth.   5. Apply the CHG Soap to your body ONLY FROM THE NECK DOWN.  Do not use on open wounds or open sores. Avoid contact with your eyes, ears, mouth and genitals (private  parts). Wash Face and genitals (private parts)  with your normal soap.   6. Wash thoroughly, paying special attention to the area where your surgery will be performed.  7. Thoroughly rinse your body with warm water from the neck down.  8. DO NOT shower/wash with your normal soap after using and rinsing off the CHG Soap.  9. Pat yourself dry with a CLEAN TOWEL.  10. Wear CLEAN PAJAMAS to bed the night before surgery, wear comfortable clothes the morning of surgery  11. Place CLEAN SHEETS on your bed the night of your first shower and DO NOT SLEEP WITH PETS.  Day of Surgery:  Do not apply any deodorants/lotions. Please shower the morning of surgery with the CHG soap  Please wear clean clothes to the hospital/surgery center.  Remember to brush your teeth WITH YOUR REGULAR TOOTHPASTE.   Please read over the  fact sheets that you were given.

## 2019-08-07 ENCOUNTER — Inpatient Hospital Stay (HOSPITAL_COMMUNITY)
Admission: RE | Admit: 2019-08-07 | Discharge: 2019-08-07 | Disposition: A | Discharge status: Home / Self Care | Payer: Medicare Other | Source: Ambulatory Visit

## 2019-08-07 ENCOUNTER — Inpatient Hospital Stay (HOSPITAL_COMMUNITY): Admission: RE | Admit: 2019-08-07 | Payer: Medicare Other | Source: Ambulatory Visit

## 2019-08-07 ENCOUNTER — Encounter (HOSPITAL_COMMUNITY): Payer: Self-pay | Admitting: Emergency Medicine

## 2019-08-10 ENCOUNTER — Inpatient Hospital Stay (HOSPITAL_COMMUNITY): Admission: RE | Admit: 2019-08-10 | Payer: Medicare Other | Source: Home / Self Care | Admitting: Neurosurgery

## 2019-08-10 ENCOUNTER — Encounter (HOSPITAL_COMMUNITY): Admission: RE | Payer: Self-pay | Source: Home / Self Care

## 2019-08-10 SURGERY — POSTERIOR LUMBAR FUSION 1 LEVEL
Anesthesia: General

## 2021-05-15 ENCOUNTER — Ambulatory Visit: Payer: Medicare Other | Admitting: Orthopaedic Surgery

## 2021-05-20 ENCOUNTER — Ambulatory Visit: Payer: Medicare Other | Admitting: Orthopaedic Surgery

## 2022-07-22 ENCOUNTER — Emergency Department (HOSPITAL_COMMUNITY)
Admission: EM | Admit: 2022-07-22 | Discharge: 2022-07-23 | Disposition: A | Discharge status: Home / Self Care | Payer: Medicare Other | Attending: Emergency Medicine | Admitting: Emergency Medicine

## 2022-07-22 ENCOUNTER — Encounter (HOSPITAL_COMMUNITY): Payer: Self-pay | Admitting: Emergency Medicine

## 2022-07-22 ENCOUNTER — Other Ambulatory Visit: Payer: Self-pay

## 2022-07-22 DIAGNOSIS — Z7982 Long term (current) use of aspirin: Secondary | ICD-10-CM | POA: Insufficient documentation

## 2022-07-22 DIAGNOSIS — E039 Hypothyroidism, unspecified: Secondary | ICD-10-CM | POA: Diagnosis not present

## 2022-07-22 DIAGNOSIS — M25561 Pain in right knee: Secondary | ICD-10-CM | POA: Diagnosis not present

## 2022-07-22 DIAGNOSIS — Z7984 Long term (current) use of oral hypoglycemic drugs: Secondary | ICD-10-CM | POA: Insufficient documentation

## 2022-07-22 DIAGNOSIS — G8929 Other chronic pain: Secondary | ICD-10-CM | POA: Diagnosis not present

## 2022-07-22 DIAGNOSIS — Z85828 Personal history of other malignant neoplasm of skin: Secondary | ICD-10-CM | POA: Insufficient documentation

## 2022-07-22 DIAGNOSIS — I10 Essential (primary) hypertension: Secondary | ICD-10-CM | POA: Diagnosis not present

## 2022-07-22 DIAGNOSIS — Z79899 Other long term (current) drug therapy: Secondary | ICD-10-CM | POA: Diagnosis not present

## 2022-07-22 DIAGNOSIS — J45909 Unspecified asthma, uncomplicated: Secondary | ICD-10-CM | POA: Diagnosis not present

## 2022-07-22 NOTE — ED Triage Notes (Signed)
Pt c/o chronic right knee pain x 2 months.

## 2022-07-23 ENCOUNTER — Emergency Department (HOSPITAL_COMMUNITY): Payer: Medicare Other

## 2022-07-23 DIAGNOSIS — M25561 Pain in right knee: Secondary | ICD-10-CM | POA: Diagnosis not present

## 2022-07-23 MED ORDER — NAPROXEN 500 MG PO TABS: 500.0000 mg | ORAL_TABLET | Freq: Two times a day (BID) | ORAL | 0 refills | Status: DC

## 2022-07-23 MED ORDER — KETOROLAC TROMETHAMINE 60 MG/2ML IM SOLN
30.0000 mg | Freq: Once | INTRAMUSCULAR | Status: AC
  Administered 2022-07-23: 30 mg via INTRAMUSCULAR
  Filled 2022-07-23: qty 2

## 2022-07-23 NOTE — Discharge Instructions (Signed)
You were seen today for knee pain.  Your x-ray is reassuring.  You need to follow-up with orthopedics for further evaluation and management.  Trial naproxen twice daily.  Do not combine this with other NSAIDs.

## 2022-07-23 NOTE — ED Provider Notes (Signed)
[facility] DEPT Provider Note   CSN: [id number] Arrival date & time: 07/22/22  2339     History  Chief Complaint  Patient presents with   Knee Pain    Nathan Aguilar is a 51 y.o. male.  HPI    This is a 51 year old male who presents with knee pain.  Patient reports he has had difficulty with right knee pain for "some time."  He reports remote injury and 2 prior surgeries.  He states that he now has daily pain.  He is mostly lateral to the knee.  He stands for 8 to 9 hours at his job.  He has been taking ibuprofen with minimal relief.  He has not had any fevers.  No overlying skin changes.  He has not seen a physician for this.   Home Medications Prior to Admission medications   Medication Sig Start Date End Date Taking? Authorizing Provider  naproxen (NAPROSYN) 500 MG tablet Take 1 tablet (500 mg total) by mouth 2 (two) times daily. 07/23/22  Yes Dyneshia Baccam, Barbette Hair, MD  acetaminophen (TYLENOL 8 HOUR) 650 MG CR tablet Take 1 tablet (650 mg total) by mouth every 8 (eight) hours as needed for pain. Patient not taking: Reported on 04/09/2017 01/28/17   [name], MD  albuterol [facility] HFA) 108 [phone] Base) MCG/ACT inhaler Inhale 2 puffs into the lungs daily.     [provider]  ALPRAZolam Duanne Moron) 1 MG tablet Take 1 mg by mouth 2 (two) times daily as needed for anxiety.     [provider]  aspirin EC 81 MG tablet Take 162 mg by mouth daily.    [provider]  BREO ELLIPTA 100-25 MCG/INH AEPB Inhale 1 puff into the lungs daily. 07/04/19   [provider]  celecoxib (CELEBREX) 200 MG capsule Take 200 mg by mouth daily.  04/27/17   [provider]  cholecalciferol (VITAMIN D) 1000 units tablet Take 1,000 Units by mouth daily.    [provider]  citalopram (CELEXA) 40 MG tablet Take 40 mg by mouth daily. 07/27/19   [provider]  diclofenac sodium (VOLTAREN) 1 % GEL Apply 2-4 g topically 4  (four) times daily. Patient not taking: Reported on 08/04/2019 08/26/16   Cherylann Ratel, PA-C  ezetimibe (ZETIA) 10 MG tablet Take 10 mg by mouth daily.    [provider]  fenofibrate micronized (LOFIBRA) 134 MG capsule Take 134 mg by mouth every morning. 07/27/19   [provider]  furosemide (LASIX) 40 MG tablet Take 40 mg by mouth daily. 08/03/19   [provider]  levothyroxine (SYNTHROID, LEVOTHROID) 25 MCG tablet Take 25 mcg by mouth daily before breakfast.    [provider]  lisinopril-hydrochlorothiazide (PRINZIDE,ZESTORETIC) 20-12.5 MG tablet Take 1 tablet by mouth daily.    [provider]  meloxicam (MOBIC) 15 MG tablet Take 15 mg by mouth daily. 08/03/19   [provider]  metFORMIN (GLUCOPHAGE) 500 MG tablet Take 500 mg by mouth daily after breakfast. 08/03/19   [provider]  methocarbamol (ROBAXIN) 500 MG tablet Take 1 tablet (500 mg total) by mouth every 6 (six) hours as needed for muscle spasms. Patient not taking: Reported on 04/09/2017 09/02/16   [name], MD  morphine (MSIR) 15 MG tablet Take 1 tablet (15 mg total) by mouth every 4 (four) hours as needed for severe pain. Patient not taking: Reported on 06/02/2017 04/09/17   Deno Etienne, DO  Multiple  Vitamins-Minerals (EMERGEN-C IMMUNE PO) Take 2 tablets by mouth daily.    [provider]  omeprazole (PRILOSEC) 40 MG capsule Take 40 mg by mouth daily. 05/29/19   [provider]  oxyCODONE-acetaminophen (PERCOCET) 7.5-325 MG tablet Take 1 tablet by mouth every 6 (six) hours as needed for severe pain.    [provider]  oxyCODONE-acetaminophen (ROXICET) 5-325 MG tablet Take 1 tablet by mouth every 6 (six) hours as needed for severe pain. Patient not taking: Reported on 04/09/2017 09/16/16   Lanae Crumbly, PA-C  potassium chloride (KLOR-CON) 10 MEQ tablet Take 10 mEq by mouth daily. 08/03/19   [provider]  propranolol  (INDERAL) 40 MG tablet Take 40 mg by mouth every evening. 08/03/19   [provider]  tamsulosin (FLOMAX) 0.4 MG CAPS capsule Take 0.4 mg by mouth every evening.  03/04/17   [provider]  temazepam (RESTORIL) 30 MG capsule Take 30 mg by mouth at bedtime as needed for sleep.    [provider]  terazosin (HYTRIN) 5 MG capsule Take 5 mg by mouth at bedtime.  08/03/19   [provider]      Allergies    Penicillins    Review of Systems   Review of Systems  Constitutional:  Negative for fever.  Musculoskeletal:        Knee pain  Skin:  Negative for color change.  All other systems reviewed and are negative.   Physical Exam Updated Vital Signs BP (!) 170/93   Pulse 99   Temp 98.4 F (36.9 C)   Resp 18   Ht 1.829 m (6')   Wt 133.4 kg   SpO2 95%   BMI 39.89 kg/m  Physical Exam Vitals and nursing note reviewed.  Constitutional:      Appearance: He is well-developed. He is obese. He is not ill-appearing.  HENT:     Head: Normocephalic and atraumatic.  Eyes:     Pupils: Pupils are equal, round, and reactive to light.  Cardiovascular:     Rate and Rhythm: Normal rate and regular rhythm.  Pulmonary:     Effort: Pulmonary effort is normal. No respiratory distress.  Abdominal:     Palpations: Abdomen is soft.     Tenderness: There is no abdominal tenderness.  Musculoskeletal:     Cervical back: Neck supple.     Comments: Normal range of motion of the right knee, tenderness palpation over the joint line laterally, no obvious effusion, crepitus noted over the patella, no overlying skin changes or erythema  Lymphadenopathy:     Cervical: No cervical adenopathy.  Skin:    General: Skin is warm and dry.  Neurological:     Mental Status: He is alert and oriented to person, place, and time.  Psychiatric:        Mood and Affect: Mood normal.     ED Results / Procedures / Treatments   Labs (all labs ordered are listed, but only abnormal  results are displayed) Labs Reviewed - No data to display  EKG None  Radiology DG Knee Complete 4 Views Right  Result Date: 07/23/2022 CLINICAL DATA:  Chronic right knee pain for 2 months. EXAM: RIGHT KNEE - COMPLETE 4+ VIEW COMPARISON:  04/09/2017. FINDINGS: No evidence of fracture, dislocation, or joint effusion. No evidence of arthropathy or other focal bone abnormality. Soft tissues are unremarkable. IMPRESSION: Negative. Electronically Signed   By: Brett Fairy M.D.   On: 07/23/2022 00:39    Procedures Procedures  Medications Ordered in ED Medications  ketorolac (TORADOL) injection 30 mg (30 mg Intramuscular Given 07/23/22 0033)    ED Course/ Medical Decision Making/ A&P                           Medical Decision Making Amount and/or Complexity of Data Reviewed Radiology: ordered.  Risk Prescription drug management.   This patient presents to the ED for concern of knee pain, this involves an extensive number of treatment options, and is a complaint that carries with it a high risk of complications and morbidity.  I considered the following differential and admission for this acute, potentially life threatening condition.  The differential diagnosis includes arthritis, sprain, torn meniscus, less likely septic joint  MDM:    This is a 51 year old male who presents with chronic right knee pain.  No acute injury.  He is nontoxic and vital signs are notable for blood pressure 170/93.  Exam is largely benign with exception of some crepitus.  No effusion.  No erythema to suggest infection.  Low suspicion for gouty arthritis.  Could be osteoarthritis related to old injuries.  X-rays obtained showed no evidence of acute fracture or significant joint space narrowing.  Will refer to orthopedics.  (Labs, imaging, consults)  Labs: I Ordered, and personally interpreted labs.  The pertinent results include: None  Imaging Studies ordered: I ordered imaging studies including s  x-ray right knee I independently visualized and interpreted imaging. I agree with the radiologist interpretation  Additional history obtained from chart review.  External records from outside source obtained and reviewed including prior evaluations  Cardiac Monitoring: The patient was maintained on a cardiac monitor.  I personally viewed and interpreted the cardiac monitored which showed an underlying rhythm of: Sinus rhythm  Reevaluation: After the interventions noted above, I reevaluated the patient and found that they have :stayed the same  Social Determinants of Health: Lives independently  Disposition: Discharge  Co morbidities that complicate the patient evaluation  Past Medical History:  Diagnosis Date   Anxiety    Asthma    Basal cell carcinoma    "right hand; inside my left foot"   Chronic bronchitis (HCC)    Chronic lower back pain    "since worker's comp accident 05/10/2016" (09/01/2016)   Depression    Dyspnea    GERD (gastroesophageal reflux disease)    [place] mal seizure ([place])    as child (09/01/2016)   Heart murmur    High cholesterol    Hypertension    Hypothyroidism    Pneumonia    "once or twice" (09/01/2016)     Medicines Meds ordered this encounter  Medications   ketorolac (TORADOL) injection 30 mg   naproxen (NAPROSYN) 500 MG tablet    Sig: Take 1 tablet (500 mg total) by mouth 2 (two) times daily.    Dispense:  30 tablet    Refill:  0    I have reviewed the patients home medicines and have made adjustments as needed  Problem List / ED Course: Problem List Items Addressed This Visit   None Visit Diagnoses     Chronic pain of right knee    -  Primary                   Final Clinical Impression(s) / ED Diagnoses Final diagnoses:  Chronic pain of right knee    Rx / DC Orders ED Discharge Orders  Ordered    naproxen (NAPROSYN) 500 MG tablet  2 times daily        07/23/22 0108              Nicolai Labonte,  Barbette Hair, MD 07/23/22 0110

## 2022-09-29 ENCOUNTER — Ambulatory Visit: Payer: Medicare Other | Admitting: Orthopaedic Surgery

## 2022-10-01 ENCOUNTER — Ambulatory Visit (INDEPENDENT_AMBULATORY_CARE_PROVIDER_SITE_OTHER): Payer: Medicare Other | Admitting: Orthopaedic Surgery

## 2022-10-01 DIAGNOSIS — G8929 Other chronic pain: Secondary | ICD-10-CM | POA: Diagnosis not present

## 2022-10-01 DIAGNOSIS — M25561 Pain in right knee: Secondary | ICD-10-CM | POA: Diagnosis not present

## 2022-10-01 MED ORDER — BUPIVACAINE HCL 0.5 % IJ SOLN
2.0000 mL | INTRAMUSCULAR | Status: AC | PRN
  Administered 2022-10-01: 2 mL via INTRA_ARTICULAR

## 2022-10-01 MED ORDER — METHYLPREDNISOLONE ACETATE 40 MG/ML IJ SUSP
40.0000 mg | INTRAMUSCULAR | Status: AC | PRN
  Administered 2022-10-01: 40 mg via INTRA_ARTICULAR

## 2022-10-01 MED ORDER — LIDOCAINE HCL 1 % IJ SOLN
2.0000 mL | INTRAMUSCULAR | Status: AC | PRN
  Administered 2022-10-01: 2 mL

## 2022-10-01 NOTE — Progress Notes (Signed)
Office Visit Note   Patient: Nathan Aguilar           Date of Birth: 1971/05/17           MRN: [id number] Visit Date: 10/01/2022              Requested by: Harvie Junior, MD [address] [place],  [place] [address] PCP: Harvie Junior, MD   Assessment & Plan: Visit Diagnoses:  1. Chronic pain of right knee     Plan: Impression is right knee osteoarthritis flare.  Due to lack of relief from naproxen and I have recommended cortisone injection which he agreed to and tolerated well.  Will see him back if his symptoms persist or if his relief is short-lived.  Follow-Up Instructions: No follow-ups on file.   Orders:  No orders of the defined types were placed in this encounter.  No orders of the defined types were placed in this encounter.     Procedures: Large Joint Inj: R knee on 10/01/2022 11:19 AM Indications: pain Details: 22 G needle  Arthrogram: No  Medications: 40 mg methylPREDNISolone acetate 40 MG/ML; 2 mL lidocaine 1 %; 2 mL bupivacaine 0.5 % Consent was given by the patient. Patient was prepped and draped in the usual sterile fashion.       Clinical Data: No additional findings.   Subjective: Chief Complaint  Patient presents with   Right Knee - Pain    HPI Nathan Aguilar is a 51 year old gentleman comes in for evaluation chronic right knee pain for 2 months.  Denies any injuries.  Initially went to the ED for evaluation was placed on naproxen which has not helped.  He has throbbing pain on both sides of the knee and feels weakness.  Denies any mechanical symptoms. Review of Systems  Constitutional: Negative.   HENT: Negative.    Eyes: Negative.   Respiratory: Negative.    Cardiovascular: Negative.   Gastrointestinal: Negative.   Endocrine: Negative.   Genitourinary: Negative.   Skin: Negative.   Allergic/Immunologic: Negative.   Neurological: Negative.   Hematological: Negative.   Psychiatric/Behavioral: Negative.    All other systems  reviewed and are negative.    Objective: Vital Signs: There were no vitals taken for this visit.  Physical Exam Vitals and nursing note reviewed.  Constitutional:      Appearance: He is well-developed.  HENT:     Head: Normocephalic and atraumatic.  Eyes:     Pupils: Pupils are equal, round, and reactive to light.  Pulmonary:     Effort: Pulmonary effort is normal.  Abdominal:     Palpations: Abdomen is soft.  Musculoskeletal:        General: Normal range of motion.     Cervical back: Neck supple.  Skin:    General: Skin is warm.  Neurological:     Mental Status: He is alert and oriented to person, place, and time.  Psychiatric:        Behavior: Behavior normal.        Thought Content: Thought content normal.        Judgment: Judgment normal.     Ortho Exam Exam and right knee shows global tenderness to palpation.  Collaterals and cruciates are stable.  No joint effusion. Specialty Comments:  No specialty comments available.  Imaging: No results found.   PMFS History: Patient Active Problem List   Diagnosis Date Noted   Spinal stenosis, lumbar region, with neurogenic claudication 09/01/2016  Class: Acute   CSF leak 09/01/2016    Class: Acute   Spinal stenosis of lumbar region with neurogenic claudication 09/01/2016   Acute respiratory failure with hypoxia ([place]) 09/01/2016   Uncontrolled hypertension 09/01/2016   Suspected OSA (obstructive sleep apnea) andobesity hypoventilation 09/01/2016   Acute pulmonary edema ([place]) 09/01/2016   Hypothyroidism 09/01/2016   Anxiety and depression 09/01/2016   Lumbar radiculopathy 07/04/2016   Cervical radiculopathy 05/18/2016   Lumbar strain 05/18/2016   Osteoarthritis of lumbar spine 05/18/2016   Chronic constipation 12/17/2015   Chronic kidney disease (CKD), stage III (moderate) ([place]) 12/17/2015   Drug therapy 12/17/2015   Hypercholesterolemia 12/17/2015   Hypertensive kidney disease with CKD stage III ([place])  12/17/2015   Localized primary osteoarthritis of lower leg 12/17/2015   Osteopenia 12/17/2015   Rotator cuff syndrome of right shoulder 12/17/2015   HNP (herniated nucleus pulposus), cervical 10/19/2011    Class: History of   Carpal tunnel syndrome, left 10/19/2011    Class: Diagnosis of   Past Medical History:  Diagnosis Date   Anxiety    Asthma    Basal cell carcinoma    "right hand; inside my left foot"   Chronic bronchitis ([place])    Chronic lower back pain    "since worker's comp accident 05/10/2016" (09/01/2016)   Depression    Dyspnea    GERD (gastroesophageal reflux disease)    [place] mal seizure ([place])    as child (09/01/2016)   Heart murmur    High cholesterol    Hypertension    Hypothyroidism    Pneumonia    "once or twice" (09/01/2016)    Family History  Problem Relation Age of Onset   Leukemia Mother    Heart disease Father     Past Surgical History:  Procedure Laterality Date   ANTERIOR CERVICAL DECOMP/DISCECTOMY FUSION  10/23/2011   Procedure: ANTERIOR CERVICAL DECOMPRESSION/DISCECTOMY FUSION 1 LEVEL;  Surgeon: [name], MD;  Location: [facility];  Service: Orthopedics;  Laterality: N/A;  Anterior cervical discectomy fusion C6-7 with local bone graft, plate, screws, Allograft bone graft   BACK SURGERY     BASAL CELL CARCINOMA EXCISION Bilateral    "right hand; inside my left foot"   CARPAL TUNNEL RELEASE Right    CARPAL TUNNEL RELEASE  10/23/2011   Procedure: CARPAL TUNNEL RELEASE;  Surgeon: [name], MD;  Location: [facility];  Service: Orthopedics;  Laterality: Left;  left carpal tunnel release   HEMILAMINOTOMY LUMBAR SPINE Bilateral 09/01/2016   partial;  L4-5/notes 09/01/2016   KNEE ARTHROSCOPY Right    "torn cartilage"   LUMBAR LAMINECTOMY Bilateral 09/01/2016   Procedure: BILATERAL PARTIAL HEMILAMINECTOMY L4-5;  Surgeon: [name], MD;  Location: [facility];  Service: Orthopedics;  Laterality: Bilateral;   TONSILLECTOMY     Social History    Occupational History   Not on file  Tobacco Use   Smoking status: Every Day    Packs/day: 1.00    Years: 27.00    Total pack years: 27.00    Types: Cigarettes   Smokeless tobacco: Never  Substance and Sexual Activity   Alcohol use: No   Drug use: No   Sexual activity: Never

## 2022-10-07 ENCOUNTER — Telehealth: Payer: Self-pay | Admitting: Orthopaedic Surgery

## 2022-10-07 NOTE — Telephone Encounter (Signed)
Patient called in requesting something for pain. States the injection did not help at all, and is in a lot of pain.

## 2022-10-08 ENCOUNTER — Other Ambulatory Visit: Payer: Self-pay | Admitting: Physician Assistant

## 2022-10-08 MED ORDER — TRAMADOL HCL 50 MG PO TABS: 50.0000 mg | ORAL_TABLET | Freq: Three times a day (TID) | ORAL | 2 refills | Status: DC | PRN

## 2022-10-08 NOTE — Telephone Encounter (Signed)
Called and notified patient.

## 2022-10-08 NOTE — Telephone Encounter (Signed)
Tried to call. No answer. No voicemail.

## 2022-10-08 NOTE — Telephone Encounter (Signed)
I sent in tramadol.  If still in pain towards the end of next week, let us know and we will get an mri.  Injection can take a few weeks to kick in

## 2022-10-13 ENCOUNTER — Other Ambulatory Visit: Payer: Self-pay

## 2022-10-13 ENCOUNTER — Telehealth: Payer: Self-pay | Admitting: Orthopaedic Surgery

## 2022-10-13 DIAGNOSIS — G8929 Other chronic pain: Secondary | ICD-10-CM

## 2022-10-13 NOTE — Telephone Encounter (Signed)
Patient states he is not feeling any better and wants to go ahead with MRI..please advise..[phone]

## 2022-10-13 NOTE — Telephone Encounter (Signed)
sure

## 2022-10-21 ENCOUNTER — Ambulatory Visit
Admission: RE | Admit: 2022-10-21 | Discharge: 2022-10-21 | Disposition: A | Discharge status: Home / Self Care | Payer: Medicare Other | Source: Ambulatory Visit | Attending: Orthopaedic Surgery | Admitting: Orthopaedic Surgery

## 2022-10-21 DIAGNOSIS — G8929 Other chronic pain: Secondary | ICD-10-CM

## 2022-10-27 ENCOUNTER — Ambulatory Visit (INDEPENDENT_AMBULATORY_CARE_PROVIDER_SITE_OTHER): Payer: Medicare Other | Admitting: Orthopaedic Surgery

## 2022-10-27 ENCOUNTER — Encounter: Payer: Self-pay | Admitting: Orthopaedic Surgery

## 2022-10-27 DIAGNOSIS — S83281A Other tear of lateral meniscus, current injury, right knee, initial encounter: Secondary | ICD-10-CM | POA: Diagnosis not present

## 2022-10-27 NOTE — Progress Notes (Signed)
Office Visit Note   Patient: Nathan Aguilar           Date of Birth: Apr 05, 1971           MRN: [id number] Visit Date: 10/27/2022              Requested by: Harvie Junior, MD [address] [place],   [address] PCP: Harvie Junior, MD   Assessment & Plan: Visit Diagnoses:  1. Acute lateral meniscus tear of right knee, initial encounter     Plan: Impression is right knee acute lateral meniscus tear.  The effusion is quite uncomfortable as well.  At this point, conservative treatments fail to provide any significant relief and the pain is severely affecting ADLs and quality of life.  Based on treatment options, the patient has elected to move forward with right knee arthroscopy with PLM.  We have discussed the surgical risks that include but are not limited to infection, neurovascular injury, incomplete relief of pain.  Recovery and prognosis were also reviewed.    Current anticoagulants: aspirin 81 mg daily Postop anticoagulation: Aspirin 81 mg Diabetic: yes Prior DVT/PE: No Tobacco use: No Clearances needed for surgery: none Anticipated discharge dispo: home   Follow-Up Instructions: No follow-ups on file.   Orders:  No orders of the defined types were placed in this encounter.  No orders of the defined types were placed in this encounter.     Procedures: No procedures performed   Clinical Data: No additional findings.   Subjective: Chief Complaint  Patient presents with   Right Knee - Follow-up    HPI Nathan Aguilar returns today to discuss right knee MRI.  Symptoms continue to be significant. Review of Systems  Constitutional: Negative.   HENT: Negative.    Eyes: Negative.   Respiratory: Negative.    Cardiovascular: Negative.   Gastrointestinal: Negative.   Endocrine: Negative.   Genitourinary: Negative.   Skin: Negative.   Allergic/Immunologic: Negative.   Neurological: Negative.   Hematological: Negative.   Psychiatric/Behavioral: Negative.     All other systems reviewed and are negative.    Objective: Vital Signs: There were no vitals taken for this visit.  Physical Exam Vitals and nursing note reviewed.  Constitutional:      Appearance: He is well-developed.  Pulmonary:     Effort: Pulmonary effort is normal.  Abdominal:     Palpations: Abdomen is soft.  Skin:    General: Skin is warm.  Neurological:     Mental Status: He is alert and oriented to person, place, and time.  Psychiatric:        Behavior: Behavior normal.        Thought Content: Thought content normal.        Judgment: Judgment normal.     Ortho Exam Knee exam unchanged Specialty Comments:  No specialty comments available.  Imaging: No results found.   PMFS History: Patient Active Problem List   Diagnosis Date Noted   Acute lateral meniscus tear of right knee 10/27/2022   Spinal stenosis, lumbar region, with neurogenic claudication 09/01/2016    Class: Acute   CSF leak 09/01/2016    Class: Acute   Spinal stenosis of lumbar region with neurogenic claudication 09/01/2016   Acute respiratory failure with hypoxia ([place]) 09/01/2016   Uncontrolled hypertension 09/01/2016   Suspected OSA (obstructive sleep apnea) andobesity hypoventilation 09/01/2016   Acute pulmonary edema ([place]) 09/01/2016   Hypothyroidism 09/01/2016   Anxiety and depression 09/01/2016   Lumbar radiculopathy 07/04/2016  Cervical radiculopathy 05/18/2016   Lumbar strain 05/18/2016   Osteoarthritis of lumbar spine 05/18/2016   Chronic constipation 12/17/2015   Chronic kidney disease (CKD), stage III (moderate) (HCC) 12/17/2015   Drug therapy 12/17/2015   Hypercholesterolemia 12/17/2015   Hypertensive kidney disease with CKD stage III ([place]) 12/17/2015   Localized primary osteoarthritis of lower leg 12/17/2015   Osteopenia 12/17/2015   Rotator cuff syndrome of right shoulder 12/17/2015   HNP (herniated nucleus pulposus), cervical 10/19/2011    Class: History of   Carpal  tunnel syndrome, left 10/19/2011    Class: Diagnosis of   Past Medical History:  Diagnosis Date   Anxiety    Asthma    Basal cell carcinoma    "right hand; inside my left foot"   Chronic bronchitis (HCC)    Chronic lower back pain    "since worker's comp accident 05/10/2016" (09/01/2016)   Depression    Dyspnea    GERD (gastroesophageal reflux disease)    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/2016)   Heart murmur    High cholesterol    Hypertension    Hypothyroidism    Pneumonia    "once or twice" (09/01/2016)    Family History  Problem Relation Age of Onset   Leukemia Mother    Heart disease Father     Past Surgical History:  Procedure Laterality Date   ANTERIOR CERVICAL DECOMP/DISCECTOMY FUSION  10/23/2011   Procedure: ANTERIOR CERVICAL DECOMPRESSION/DISCECTOMY FUSION 1 LEVEL;  Surgeon: [name], MD;  Location: [facility];  Service: Orthopedics;  Laterality: N/A;  Anterior cervical discectomy fusion C6-7 with local bone graft, plate, screws, Allograft bone graft   BACK SURGERY     BASAL CELL CARCINOMA EXCISION Bilateral    "right hand; inside my left foot"   CARPAL TUNNEL RELEASE Right    CARPAL TUNNEL RELEASE  10/23/2011   Procedure: CARPAL TUNNEL RELEASE;  Surgeon: [name], MD;  Location: [facility];  Service: Orthopedics;  Laterality: Left;  left carpal tunnel release   HEMILAMINOTOMY LUMBAR SPINE Bilateral 09/01/2016   partial;  L4-5/notes 09/01/2016   KNEE ARTHROSCOPY Right    "torn cartilage"   LUMBAR LAMINECTOMY Bilateral 09/01/2016   Procedure: BILATERAL PARTIAL HEMILAMINECTOMY L4-5;  Surgeon: [name], MD;  Location: [facility];  Service: Orthopedics;  Laterality: Bilateral;   TONSILLECTOMY     Social History   Occupational History   Not on file  Tobacco Use   Smoking status: Every Day    Packs/day: 1.00    Years: 27.00    Total pack years: 27.00    Types: Cigarettes   Smokeless tobacco: Never  Substance and Sexual Activity   Alcohol use: No    Drug use: No   Sexual activity: Never

## 2022-11-11 ENCOUNTER — Other Ambulatory Visit: Payer: Self-pay | Admitting: Physician Assistant

## 2022-11-11 MED ORDER — HYDROCODONE-ACETAMINOPHEN 5-325 MG PO TABS: 1.0000 | ORAL_TABLET | Freq: Three times a day (TID) | ORAL | 0 refills | Status: DC | PRN

## 2022-11-11 MED ORDER — ONDANSETRON HCL 4 MG PO TABS: 4.0000 mg | ORAL_TABLET | Freq: Three times a day (TID) | ORAL | 0 refills | Status: AC | PRN

## 2022-11-12 ENCOUNTER — Encounter: Payer: Medicare Other | Admitting: Orthopaedic Surgery

## 2022-11-14 ENCOUNTER — Encounter (HOSPITAL_BASED_OUTPATIENT_CLINIC_OR_DEPARTMENT_OTHER): Payer: Self-pay

## 2022-11-14 ENCOUNTER — Emergency Department (HOSPITAL_BASED_OUTPATIENT_CLINIC_OR_DEPARTMENT_OTHER)
Admission: EM | Admit: 2022-11-14 | Discharge: 2022-11-14 | Disposition: A | Discharge status: Home / Self Care | Payer: No Typology Code available for payment source | Attending: Emergency Medicine | Admitting: Emergency Medicine

## 2022-11-14 ENCOUNTER — Other Ambulatory Visit: Payer: Self-pay

## 2022-11-14 DIAGNOSIS — W260XXA Contact with knife, initial encounter: Secondary | ICD-10-CM | POA: Insufficient documentation

## 2022-11-14 DIAGNOSIS — S6992XA Unspecified injury of left wrist, hand and finger(s), initial encounter: Secondary | ICD-10-CM | POA: Diagnosis present

## 2022-11-14 DIAGNOSIS — Z23 Encounter for immunization: Secondary | ICD-10-CM | POA: Diagnosis not present

## 2022-11-14 DIAGNOSIS — Z7982 Long term (current) use of aspirin: Secondary | ICD-10-CM | POA: Diagnosis not present

## 2022-11-14 DIAGNOSIS — S61012A Laceration without foreign body of left thumb without damage to nail, initial encounter: Secondary | ICD-10-CM | POA: Diagnosis not present

## 2022-11-14 DIAGNOSIS — Y93G3 Activity, cooking and baking: Secondary | ICD-10-CM | POA: Insufficient documentation

## 2022-11-14 MED ORDER — LIDOCAINE HCL (PF) 1 % IJ SOLN
10.0000 mL | Freq: Once | INTRAMUSCULAR | Status: AC
  Administered 2022-11-14: 10 mL
  Filled 2022-11-14: qty 10

## 2022-11-14 MED ORDER — TETANUS-DIPHTH-ACELL PERTUSSIS 5-2.5-18.5 LF-MCG/0.5 IM SUSY
0.5000 mL | PREFILLED_SYRINGE | Freq: Once | INTRAMUSCULAR | Status: AC
  Administered 2022-11-14: 0.5 mL via INTRAMUSCULAR
  Filled 2022-11-14: qty 0.5

## 2022-11-14 MED ORDER — CEPHALEXIN 500 MG PO CAPS: 500.0000 mg | ORAL_CAPSULE | Freq: Four times a day (QID) | ORAL | 0 refills | Status: AC

## 2022-11-14 NOTE — Discharge Instructions (Signed)
Your exam today is reassuring.  You had 2 stitches placed for laceration repair.  These will need to be removed in 7 days.  You can follow-up with your primary care doctor or return to the emergency department or urgent care to have these removed.  Be on the look out for signs of infection such as worsening redness, drainage from the site, or fever without another source.

## 2022-11-14 NOTE — ED Triage Notes (Signed)
Patient here POV from Work.  Endorses cutting his Finger with a Knife while cooking that occurred approximately 30 minutes ago.   Tetanus Not UTD. 2 cm laceration to Left Distal First Digit.   NAD Noted during Triage. A&Ox4. GCS 15. Ambulatory.

## 2022-11-14 NOTE — ED Provider Notes (Signed)
[facility] Provider Note   CSN: [id number] Arrival date & time: 11/14/22  1558     History  Chief Complaint  Patient presents with   Laceration    CURLIE MACKEN is a 52 y.o. male.  52 year old male presents today following a laceration.  Patient is right-hand dominant.  Laceration is to the distal left thumb.  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  Bleeding is controlled.  Denies other injuries.  Reports that his tetanus is not up-to-date.  The history is provided by the patient. No language interpreter was used.       Home Medications Prior to Admission medications   Medication Sig Start Date End Date Taking? Authorizing Provider  cephALEXin (KEFLEX) 500 MG capsule Take 1 capsule (500 mg total) by mouth 4 (four) times daily for 7 days. 11/14/22 11/21/22 Yes Shaleta Ruacho, PA-C  acetaminophen (TYLENOL 8 HOUR) 650 MG CR tablet Take 1 tablet (650 mg total) by mouth every 8 (eight) hours as needed for pain. 01/28/17   [name], MD  albuterol (PROAIR HFA) 108 (90 Base) MCG/ACT inhaler Inhale 2 puffs into the lungs daily.    [provider]  ALPRAZolam Duanne Moron) 1 MG tablet Take 1 mg by mouth 2 (two) times daily as needed for anxiety.     [provider]  aspirin EC 81 MG tablet Take 162 mg by mouth daily.    [provider]  BREO ELLIPTA 100-25 MCG/INH AEPB Inhale 1 puff into the lungs daily. 07/04/19   [provider]  cholecalciferol (VITAMIN D) 1000 units tablet Take 1,000 Units by mouth daily.    [provider]  diclofenac sodium (VOLTAREN) 1 % GEL Apply 2-4 g topically 4 (four) times daily. 08/26/16   Cherylann Ratel, PA-C  ezetimibe (ZETIA) 10 MG tablet Take 10 mg by mouth daily.    [provider]  HYDROcodone-acetaminophen (NORCO) 5-325 MG tablet Take 1 tablet by mouth 3 (three) times daily as needed.  To be taken after surgery 11/11/22   Aundra Dubin, PA-C  levothyroxine (SYNTHROID, LEVOTHROID) 25 MCG tablet Take 25 mcg by mouth daily before breakfast.    [provider]  lisinopril-hydrochlorothiazide (PRINZIDE,ZESTORETIC) 20-12.5 MG tablet Take 1 tablet by mouth daily.    [provider]  metFORMIN (GLUCOPHAGE) 500 MG tablet Take 500 mg by mouth daily after breakfast. 08/03/19   [provider]  methocarbamol (ROBAXIN) 500 MG tablet Take 1 tablet (500 mg total) by mouth every 6 (six) hours as needed for muscle spasms. 09/02/16   [name], MD  Multiple Vitamins-Minerals (EMERGEN-C IMMUNE PO) Take 2 tablets by mouth daily.    [provider]  naproxen (NAPROSYN) 500 MG tablet Take 1 tablet (500 mg total) by mouth 2 (two) times daily. 07/23/22   Horton, Barbette Hair, MD  omeprazole (PRILOSEC) 40 MG capsule Take 40 mg by mouth daily. 05/29/19   [provider]  ondansetron (ZOFRAN) 4 MG tablet Take 1 tablet (4 mg total) by mouth every 8 (eight) hours as needed for nausea or vomiting. 11/11/22   Aundra Dubin, PA-C  oxyCODONE-acetaminophen (ROXICET) 5-325 MG tablet Take 1 tablet by mouth every 6 (six) hours as needed for severe pain. 09/16/16   Lanae Crumbly, PA-C  potassium chloride (KLOR-CON) 10 MEQ tablet Take 10 mEq by mouth daily. 08/03/19   [provider]  propranolol (INDERAL) 40 MG tablet Take 40 mg by mouth every evening. 08/03/19   [provider]  tamsulosin (FLOMAX) 0.4 MG CAPS capsule Take 0.4 mg by mouth every evening.  03/04/17   [provider]  temazepam (RESTORIL) 30 MG capsule Take 30 mg by mouth at bedtime as needed for sleep.    [provider]  terazosin (HYTRIN) 5 MG capsule Take 5 mg by mouth at bedtime. 08/03/19   [provider]  traMADol (ULTRAM) 50 MG tablet Take 1 tablet (50 mg total) by mouth 3 (three) times daily as needed. 10/08/22   Aundra Dubin, PA-C       Allergies    Penicillins    Review of Systems   Review of Systems  Constitutional:  Negative for fever.  Skin:  Positive for wound.  All other systems reviewed and are negative.   Physical Exam Updated Vital Signs BP (!) 155/85 (BP Location: Right Arm)   Pulse 74   Temp 98.2 F (36.8 C) (Oral)   Resp 16   Ht 6' (1.829 m)   Wt 133.4 kg   SpO2 95%   BMI 39.89 kg/m  Physical Exam Vitals and nursing note reviewed.  Constitutional:      General: He is not in acute distress.    Appearance: Normal appearance. He is not ill-appearing.  HENT:     Head: Normocephalic and atraumatic.     Nose: Nose normal.  Eyes:     Conjunctiva/sclera: Conjunctivae normal.  Cardiovascular:     Rate and Rhythm: Normal rate and regular rhythm.  Pulmonary:     Effort: Pulmonary effort is normal. No respiratory distress.  Musculoskeletal:        General: No deformity. Normal range of motion.     Comments: Full range of motion in all digits of the left hand.  Including at the DIP and the PIP joint of the left thumb.  Neurovascularly intact.  Sensation intact.  2+ radial pulse present.  About 1 cm avulsed laceration noted to distal end of thumb.  Nailbed is not affected.  Skin:    Findings: No rash.  Neurological:     Mental Status: He is alert.     ED Results / Procedures / Treatments   Labs (all labs ordered are listed, but only abnormal results are displayed) Labs Reviewed - No data to display  EKG None  Radiology No results found.  Procedures .Marland KitchenLaceration Repair  Date/Time: 11/14/2022 5:39 PM  Performed by: Evlyn Courier, PA-C Authorized by: Evlyn Courier, PA-C   Consent:    Consent obtained:  Verbal   Consent given by:  Patient   Risks discussed:  Infection, poor wound healing, vascular damage, pain, nerve damage and poor cosmetic result   Alternatives discussed:  No treatment Universal protocol:    Procedure explained and questions answered to patient or proxy's satisfaction:  yes     Relevant documents present and verified: yes     Test results available: yes     Patient identity confirmed:  Verbally with patient Anesthesia:    Anesthesia method:  None Laceration details:    Location:  Finger   Finger location:  L thumb   Length (cm):  1 Pre-procedure details:    Preparation:  Patient was prepped and draped in usual sterile fashion Treatment:    Area cleansed with:  Povidone-iodine and saline   Amount of cleaning:  Standard   Irrigation volume:  250   Debridement:  None   Undermining:  None Skin repair:    Repair method:  Sutures   Suture size:  4-0   Suture material:  Prolene   Number of sutures:  2 Approximation:    Approximation:  Close Repair type:    Repair type:  Simple Post-procedure details:    Dressing:  Non-adherent dressing   Procedure completion:  Tolerated well, no immediate complications     Medications Ordered in ED Medications  lidocaine (PF) (XYLOCAINE) 1 % injection 10 mL (10 mLs Infiltration Given by Other 11/14/22 1634)  Tdap (BOOSTRIX) injection 0.5 mL (0.5 mLs Intramuscular Given 11/14/22 1622)    ED Course/ Medical Decision Making/ A&P                             Medical Decision Making Risk Prescription drug management.   52 year old male presents today for evaluation of laceration to distal end of thumb.  This is an avulsion laceration.  Nailbed is not affected.  Neurovascularly intact.  Tetanus was updated today.  No active bleeding.  Laceration repaired with 2 stitches.  Return precaution discussed.  Discussed these will need to be removed in 7 days.  Patient voices understanding and is in agreement with plan.   Final Clinical Impression(s) / ED Diagnoses Final diagnoses:  Laceration of left thumb without foreign body without damage to nail, initial encounter    Rx / DC Orders ED Discharge Orders          Ordered    cephALEXin (KEFLEX) 500 MG capsule  4 times daily       Note to Pharmacy: Denies  anaphylaxis to penicillins.  Has tolerated Augmentin in the past with no issue.  Low concern for cross-reaction.   11/14/22 1707              Evlyn Courier, PA-C 11/14/22 1743    Tretha Sciara, MD 11/15/22 [phone]

## 2022-11-14 NOTE — ED Notes (Signed)
Discharge paperwork given and verbally understood. 

## 2022-11-16 ENCOUNTER — Other Ambulatory Visit: Payer: Self-pay | Admitting: Physician Assistant

## 2022-11-19 ENCOUNTER — Encounter: Payer: Self-pay | Admitting: Orthopaedic Surgery

## 2022-11-19 DIAGNOSIS — S83281A Other tear of lateral meniscus, current injury, right knee, initial encounter: Secondary | ICD-10-CM | POA: Diagnosis not present

## 2022-11-19 DIAGNOSIS — S83203A Other tear of unspecified meniscus, current injury, right knee, initial encounter: Secondary | ICD-10-CM | POA: Diagnosis not present

## 2022-11-26 ENCOUNTER — Encounter: Payer: Self-pay | Admitting: Orthopaedic Surgery

## 2022-11-26 ENCOUNTER — Other Ambulatory Visit: Payer: Self-pay | Admitting: Physician Assistant

## 2022-11-26 ENCOUNTER — Ambulatory Visit (INDEPENDENT_AMBULATORY_CARE_PROVIDER_SITE_OTHER): Payer: Medicare Other | Admitting: Physician Assistant

## 2022-11-26 DIAGNOSIS — Z9889 Other specified postprocedural states: Secondary | ICD-10-CM

## 2022-11-26 MED ORDER — HYDROCODONE-ACETAMINOPHEN 5-325 MG PO TABS: 1.0000 | ORAL_TABLET | Freq: Every day | ORAL | 0 refills | Status: AC | PRN

## 2022-11-26 NOTE — Progress Notes (Signed)
Post-Op Visit Note   Patient: Nathan Aguilar           Date of Birth: 1971/05/23           MRN: [id number] Visit Date: 11/26/2022 PCP: Harvie Junior, MD   Assessment & Plan:  Chief Complaint:  Chief Complaint  Patient presents with   Right Knee - Follow-up    Right knee arthroscopy 11/19/2022   Visit Diagnoses:  1. S/P right knee arthroscopy     Plan: Patient is a pleasant 52 year old gentleman who comes in today 1 week status post right knee arthroscopic debridement medial and lateral meniscectomy 11/19/2022.  He has been doing well.  He has been taking occasional hydrocodone which does help.  He has been working on a home exercise program.  Examination of his right knee reveals well-healed surgical portals with nylon sutures in place.  No evidence of infection or cellulitis.  Calves are soft nontender.  He is neurovascular intact distally.  Today, sutures were removed and Steri-Strips applied.  Intraoperative pictures reviewed.  Home exercise program provided.  I refilled his Norco once more.  Follow-up in 5 weeks for recheck.  Call with concerns or questions.  Follow-Up Instructions: Return in about 5 weeks (around 12/31/2022).   Orders:  No orders of the defined types were placed in this encounter.  No orders of the defined types were placed in this encounter.   Imaging: No new imaging  PMFS History: Patient Active Problem List   Diagnosis Date Noted   Acute lateral meniscus tear of right knee 10/27/2022   Spinal stenosis, lumbar region, with neurogenic claudication 09/01/2016    Class: Acute   CSF leak 09/01/2016    Class: Acute   Spinal stenosis of lumbar region with neurogenic claudication 09/01/2016   Acute respiratory failure with hypoxia ([place]) 09/01/2016   Uncontrolled hypertension 09/01/2016   Suspected OSA (obstructive sleep apnea) andobesity hypoventilation 09/01/2016   Acute pulmonary edema ([place]) 09/01/2016   Hypothyroidism 09/01/2016   Anxiety and  depression 09/01/2016   Lumbar radiculopathy 07/04/2016   Cervical radiculopathy 05/18/2016   Lumbar strain 05/18/2016   Osteoarthritis of lumbar spine 05/18/2016   Chronic constipation 12/17/2015   Chronic kidney disease (CKD), stage III (moderate) ([place]) 12/17/2015   Drug therapy 12/17/2015   Hypercholesterolemia 12/17/2015   Hypertensive kidney disease with CKD stage III ([place]) 12/17/2015   Localized primary osteoarthritis of lower leg 12/17/2015   Osteopenia 12/17/2015   Rotator cuff syndrome of right shoulder 12/17/2015   HNP (herniated nucleus pulposus), cervical 10/19/2011    Class: History of   Carpal tunnel syndrome, left 10/19/2011    Class: Diagnosis of   Past Medical History:  Diagnosis Date   Anxiety    Asthma    Basal cell carcinoma    "right hand; inside my left foot"   Chronic bronchitis ([place])    Chronic lower back pain    "since worker's comp accident 05/10/2016" (09/01/2016)   Depression    Dyspnea    GERD (gastroesophageal reflux disease)    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 (09/01/2016)   Heart murmur    High cholesterol    Hypertension    Hypothyroidism    Pneumonia    "once or twice" (09/01/2016)    Family History  Problem Relation Age of Onset   Leukemia Mother    Heart disease Father     Past Surgical History:  Procedure Laterality Date   ANTERIOR CERVICAL DECOMP/DISCECTOMY FUSION  10/23/2011   Procedure: ANTERIOR CERVICAL DECOMPRESSION/DISCECTOMY FUSION 1 LEVEL;  Surgeon: [name], MD;  Location: [facility];  Service: Orthopedics;  Laterality: N/A;  Anterior cervical discectomy fusion C6-7 with local bone graft, plate, screws, Allograft bone graft   BACK SURGERY     BASAL CELL CARCINOMA EXCISION Bilateral    "right hand; inside my left foot"   CARPAL TUNNEL RELEASE Right    CARPAL TUNNEL RELEASE  10/23/2011   Procedure: CARPAL TUNNEL RELEASE;  Surgeon: [name], MD;  Location: [facility];  Service: Orthopedics;  Laterality: Left;  left carpal  tunnel release   HEMILAMINOTOMY LUMBAR SPINE Bilateral 09/01/2016   partial;  L4-5/notes 09/01/2016   KNEE ARTHROSCOPY Right    "torn cartilage"   LUMBAR LAMINECTOMY Bilateral 09/01/2016   Procedure: BILATERAL PARTIAL HEMILAMINECTOMY L4-5;  Surgeon: [name], MD;  Location: [facility];  Service: Orthopedics;  Laterality: Bilateral;   TONSILLECTOMY     Social History   Occupational History   Not on file  Tobacco Use   Smoking status: Every Day    Packs/day: 1.00    Years: 27.00    Total pack years: 27.00    Types: Cigarettes   Smokeless tobacco: Never  Substance and Sexual Activity   Alcohol use: No   Drug use: No   Sexual activity: Never

## 2022-12-24 ENCOUNTER — Ambulatory Visit (INDEPENDENT_AMBULATORY_CARE_PROVIDER_SITE_OTHER): Payer: Medicare Other | Admitting: Orthopaedic Surgery

## 2022-12-24 DIAGNOSIS — Z9889 Other specified postprocedural states: Secondary | ICD-10-CM

## 2022-12-24 MED ORDER — NAPROXEN 500 MG PO TABS: 500.0000 mg | ORAL_TABLET | Freq: Two times a day (BID) | ORAL | 3 refills | Status: AC

## 2022-12-24 NOTE — Progress Notes (Signed)
Post-Op Visit Note   Patient: Nathan Aguilar           Date of Birth: [birth year]           MRN: [id number] Visit Date: 12/24/2022 PCP: Harvie Junior, MD   Assessment & Plan:  Chief Complaint:  Chief Complaint  Patient presents with   Right Knee - Routine Post Op   Visit Diagnoses:  1. S/P right knee arthroscopy     Plan: Mr. Hildred Priest is approximately 5 weeks status post right knee scope.  He feels swelling and tightness across the top of the knee.  He has been out of work since his surgery.  Examination shows moderate joint effusion.  Fully healed surgical scars.  No signs of infection.  Range of motion is pretty much back to normal.  No joint line tenderness.  Impression is postsurgical joint effusion.  Patient opted for 2 weeks of NSAIDs instead of aspiration and injection.  I sent in prescription for naproxen.  Work note provided today.  Follow-up if he decides he wants the effusion aspirated.  Follow-Up Instructions: Return if symptoms worsen or fail to improve.   Orders:  No orders of the defined types were placed in this encounter.  Meds ordered this encounter  Medications   naproxen (NAPROSYN) 500 MG tablet    Sig: Take 1 tablet (500 mg total) by mouth 2 (two) times daily with a meal.    Dispense:  30 tablet    Refill:  3    Imaging: No results found.  PMFS History: Patient Active Problem List   Diagnosis Date Noted   Acute lateral meniscus tear of right knee 10/27/2022   Spinal stenosis, lumbar region, with neurogenic claudication 09/01/2016    Class: Acute   CSF leak 09/01/2016    Class: Acute   Spinal stenosis of lumbar region with neurogenic claudication 09/01/2016   Acute respiratory failure with hypoxia ([place]) 09/01/2016   Uncontrolled hypertension 09/01/2016   Suspected OSA (obstructive sleep apnea) andobesity hypoventilation 09/01/2016   Acute pulmonary edema ([place]) 09/01/2016   Hypothyroidism 09/01/2016   Anxiety and depression 09/01/2016    Lumbar radiculopathy 07/04/2016   Cervical radiculopathy 05/18/2016   Lumbar strain 05/18/2016   Osteoarthritis of lumbar spine 05/18/2016   Chronic constipation 12/17/2015   Chronic kidney disease (CKD), stage III (moderate) ([place]) 12/17/2015   Drug therapy 12/17/2015   Hypercholesterolemia 12/17/2015   Hypertensive kidney disease with CKD stage III ([place]) 12/17/2015   Localized primary osteoarthritis of lower leg 12/17/2015   Osteopenia 12/17/2015   Rotator cuff syndrome of right shoulder 12/17/2015   HNP (herniated nucleus pulposus), cervical 10/19/2011    Class: History of   Carpal tunnel syndrome, left 10/19/2011    Class: Diagnosis of   Past Medical History:  Diagnosis Date   Anxiety    Asthma    Basal cell carcinoma    "right hand; inside my left foot"   Chronic bronchitis ([place])    Chronic lower back pain    "since worker's comp accident 05/10/2016" (09/01/2016)   Depression    Dyspnea    GERD (gastroesophageal reflux disease)    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 (09/01/2016)   Heart murmur    High cholesterol    Hypertension    Hypothyroidism    Pneumonia    "once or twice" (09/01/2016)    Family History  Problem Relation Age of Onset   Leukemia Mother    Heart disease Father  Past Surgical History:  Procedure Laterality Date   ANTERIOR CERVICAL DECOMP/DISCECTOMY FUSION  10/23/2011   Procedure: ANTERIOR CERVICAL DECOMPRESSION/DISCECTOMY FUSION 1 LEVEL;  Surgeon: [name], MD;  Location: [facility];  Service: Orthopedics;  Laterality: N/A;  Anterior cervical discectomy fusion C6-7 with local bone graft, plate, screws, Allograft bone graft   BACK SURGERY     BASAL CELL CARCINOMA EXCISION Bilateral    "right hand; inside my left foot"   CARPAL TUNNEL RELEASE Right    CARPAL TUNNEL RELEASE  10/23/2011   Procedure: CARPAL TUNNEL RELEASE;  Surgeon: [name], MD;  Location: [facility];  Service: Orthopedics;  Laterality: Left;  left carpal tunnel release    HEMILAMINOTOMY LUMBAR SPINE Bilateral 09/01/2016   partial;  L4-5/notes 09/01/2016   KNEE ARTHROSCOPY Right    "torn cartilage"   LUMBAR LAMINECTOMY Bilateral 09/01/2016   Procedure: BILATERAL PARTIAL HEMILAMINECTOMY L4-5;  Surgeon: [name], MD;  Location: [facility];  Service: Orthopedics;  Laterality: Bilateral;   TONSILLECTOMY     Social History   Occupational History   Not on file  Tobacco Use   Smoking status: Every Day    Packs/day: 1.00    Years: 27.00    Additional pack years: 0.00    Total pack years: 27.00    Types: Cigarettes   Smokeless tobacco: Never  Substance and Sexual Activity   Alcohol use: No   Drug use: No   Sexual activity: Never

## 2022-12-31 ENCOUNTER — Encounter: Payer: Medicare Other | Admitting: Orthopaedic Surgery

## 2023-03-16 ENCOUNTER — Ambulatory Visit (INDEPENDENT_AMBULATORY_CARE_PROVIDER_SITE_OTHER): Payer: Medicare Other | Admitting: Orthopaedic Surgery

## 2023-03-16 DIAGNOSIS — M1711 Unilateral primary osteoarthritis, right knee: Secondary | ICD-10-CM

## 2023-03-16 DIAGNOSIS — Z9889 Other specified postprocedural states: Secondary | ICD-10-CM

## 2023-03-16 MED ORDER — CELECOXIB 200 MG PO CAPS: 200.0000 mg | ORAL_CAPSULE | Freq: Two times a day (BID) | ORAL | 3 refills | Status: AC

## 2023-03-16 NOTE — Progress Notes (Signed)
Office Visit Note   Patient: Nathan Aguilar           Date of Birth: [birth year]           MRN: [id number] Visit Date: 03/16/2023              Requested by: Jearld Lesch, MD [address] [place],  [place] [address] PCP: Jearld Lesch, MD   Assessment & Plan: Visit Diagnoses:  1. S/P right knee arthroscopy   2. Primary osteoarthritis of right knee     Plan: Impression 52 year old gentleman with chronic right knee pain likely etiology is symptomatic chondromalacia.  Will obtain arthritis panel to rule out autoimmune process.  Will prescribe Celebrex to try.  Will order outpatient physical therapy for strengthening.  Follow-Up Instructions: No follow-ups on file.   Orders:  No orders of the defined types were placed in this encounter.  Meds ordered this encounter  Medications   celecoxib (CELEBREX) 200 MG capsule    Sig: Take 1 capsule (200 mg total) by mouth 2 (two) times daily.    Dispense:  30 capsule    Refill:  3      Procedures: No procedures performed   Clinical Data: No additional findings.   Subjective: Chief Complaint  Patient presents with   Right Knee - Pain    HPI Mr. Nathan Aguilar comes in today for chronic right knee pain.  He has pain throughout the knee that is worse with standing.  He states the pain is worse at night. Review of Systems  Constitutional: Negative.   HENT: Negative.    Eyes: Negative.   Respiratory: Negative.    Cardiovascular: Negative.   Gastrointestinal: Negative.   Endocrine: Negative.   Genitourinary: Negative.   Skin: Negative.   Allergic/Immunologic: Negative.   Neurological: Negative.   Hematological: Negative.   Psychiatric/Behavioral: Negative.    All other systems reviewed and are negative.    Objective: Vital Signs: There were no vitals taken for this visit.  Physical Exam Vitals and nursing note reviewed.  Constitutional:      Appearance: He is well-developed.  Pulmonary:     Effort: Pulmonary effort  is normal.  Abdominal:     Palpations: Abdomen is soft.  Skin:    General: Skin is warm.  Neurological:     Mental Status: He is alert and oriented to person, place, and time.  Psychiatric:        Behavior: Behavior normal.        Thought Content: Thought content normal.        Judgment: Judgment normal.     Ortho Exam Examination of the right knee shows fully healed surgical scars.  No joint effusion.  Reports global pain.  No joint line tenderness.  No significant crepitus. Specialty Comments:  No specialty comments available.  Imaging: No results found.   PMFS History: Patient Active Problem List   Diagnosis Date Noted   Acute lateral meniscus tear of right knee 10/27/2022   Spinal stenosis, lumbar region, with neurogenic claudication 09/01/2016    Class: Acute   CSF leak 09/01/2016    Class: Acute   Spinal stenosis of lumbar region with neurogenic claudication 09/01/2016   Acute respiratory failure with hypoxia (HCC) 09/01/2016   Uncontrolled hypertension 09/01/2016   Suspected OSA (obstructive sleep apnea) andobesity hypoventilation 09/01/2016   Acute pulmonary edema (HCC) 09/01/2016   Hypothyroidism 09/01/2016   Anxiety and depression 09/01/2016   Lumbar radiculopathy 07/04/2016   Cervical radiculopathy  05/18/2016   Lumbar strain 05/18/2016   Osteoarthritis of lumbar spine 05/18/2016   Chronic constipation 12/17/2015   Chronic kidney disease (CKD), stage III (moderate) (HCC) 12/17/2015   Drug therapy 12/17/2015   Hypercholesterolemia 12/17/2015   Hypertensive kidney disease with CKD stage III (HCC) 12/17/2015   Localized primary osteoarthritis of lower leg 12/17/2015   Osteopenia 12/17/2015   Rotator cuff syndrome of right shoulder 12/17/2015   HNP (herniated nucleus pulposus), cervical 10/19/2011    Class: History of   Carpal tunnel syndrome, left 10/19/2011    Class: Diagnosis of   Past Medical History:  Diagnosis Date   Anxiety    Asthma    Basal  cell carcinoma    "right hand; inside my left foot"   Chronic bronchitis (HCC)    Chronic lower back pain    "since worker's comp accident 05/10/2016" (09/01/2016)   Depression    Dyspnea    GERD (gastroesophageal reflux disease)    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 (09/01/2016)   Heart murmur    High cholesterol    Hypertension    Hypothyroidism    Pneumonia    "once or twice" (09/01/2016)    Family History  Problem Relation Age of Onset   Leukemia Mother    Heart disease Father     Past Surgical History:  Procedure Laterality Date   ANTERIOR CERVICAL DECOMP/DISCECTOMY FUSION  10/23/2011   Procedure: ANTERIOR CERVICAL DECOMPRESSION/DISCECTOMY FUSION 1 LEVEL;  Surgeon: Kerrin Champagne, MD;  Location: [facility] OR;  Service: Orthopedics;  Laterality: N/A;  Anterior cervical discectomy fusion C6-7 with local bone graft, plate, screws, Allograft bone graft   BACK SURGERY     BASAL CELL CARCINOMA EXCISION Bilateral    "right hand; inside my left foot"   CARPAL TUNNEL RELEASE Right    CARPAL TUNNEL RELEASE  10/23/2011   Procedure: CARPAL TUNNEL RELEASE;  Surgeon: Kerrin Champagne, MD;  Location: [facility] OR;  Service: Orthopedics;  Laterality: Left;  left carpal tunnel release   HEMILAMINOTOMY LUMBAR SPINE Bilateral 09/01/2016   partial;  L4-5/notes 09/01/2016   KNEE ARTHROSCOPY Right    "torn cartilage"   LUMBAR LAMINECTOMY Bilateral 09/01/2016   Procedure: BILATERAL PARTIAL HEMILAMINECTOMY L4-5;  Surgeon: Kerrin Champagne, MD;  Location: [facility] OR;  Service: Orthopedics;  Laterality: Bilateral;   TONSILLECTOMY     Social History   Occupational History   Not on file  Tobacco Use   Smoking status: Every Day    Packs/day: 1.00    Years: 27.00    Additional pack years: 0.00    Total pack years: 27.00    Types: Cigarettes   Smokeless tobacco: Never  Substance and Sexual Activity   Alcohol use: No   Drug use: No   Sexual activity: Never

## 2023-03-17 LAB — ANA: Anti Nuclear Antibody (ANA): NEGATIVE

## 2023-03-17 LAB — URIC ACID: Uric Acid, Serum: 4.2 mg/dL (ref 4.0–8.0)

## 2023-03-17 LAB — SEDIMENTATION RATE: Sed Rate: 6 mm/h (ref 0–20)

## 2023-03-17 LAB — RHEUMATOID FACTOR: Rheumatoid fact SerPl-aCnc: <10 IU/mL (ref <14)

## 2023-04-14 ENCOUNTER — Telehealth: Payer: Self-pay | Admitting: Neurology

## 2023-04-14 NOTE — Telephone Encounter (Signed)
LVM and sent mychart msg informing pt of need to reschedule 05/18/23 appt - MD out

## 2023-04-27 ENCOUNTER — Ambulatory Visit: Payer: Medicare Other | Admitting: Orthopaedic Surgery

## 2023-04-27 DIAGNOSIS — Z9889 Other specified postprocedural states: Secondary | ICD-10-CM

## 2023-04-27 NOTE — Progress Notes (Signed)
Office Visit Note   Patient: Nathan Aguilar           Date of Birth: 05-23-1971           MRN: [id number] Visit Date: 04/27/2023              Requested by: Jearld Lesch, MD [address] [place],  [place] [address] PCP: Jearld Lesch, MD   Assessment & Plan: Visit Diagnoses:  1. S/P right knee arthroscopy     Plan: [name] is a 52 year old gentleman with chronic right knee pain.  I do not have a great reason as to why he continues to have pain.  He had no chondromalacia intraoperatively.  His pain is not consistent with meniscal pathology.  I think is more related to just overall muscular weakness and deconditioning.  I will make a referral to physical therapy for this.  Will see him back as needed.  Follow-Up Instructions: No follow-ups on file.   Orders:  Orders Placed This Encounter  Procedures   Ambulatory referral to Physical Therapy   No orders of the defined types were placed in this encounter.     Procedures: No procedures performed   Clinical Data: No additional findings.   Subjective: Chief Complaint  Patient presents with   Right Knee - Follow-up    HPI [name] returns today for follow-up for his right knee.  He is 5 months status post right knee scope and partial medial meniscectomy.  He states he has pain when he is on his feet. Review of Systems   Objective: Vital Signs: There were no vitals taken for this visit.  Physical Exam  Ortho Exam Examination of right knee is nonfocal.  No joint effusion.  Fully healed surgical scars.  Fluid painless range of motion. Specialty Comments:  No specialty comments available.  Imaging: No results found.   PMFS History: Patient Active Problem List   Diagnosis Date Noted   Acute lateral meniscus tear of right knee 10/27/2022   Spinal stenosis, lumbar region, with neurogenic claudication 09/01/2016    Class: Acute   CSF leak 09/01/2016    Class: Acute   Spinal stenosis of lumbar region  with neurogenic claudication 09/01/2016   Acute respiratory failure with hypoxia (HCC) 09/01/2016   Uncontrolled hypertension 09/01/2016   Suspected OSA (obstructive sleep apnea) andobesity hypoventilation 09/01/2016   Acute pulmonary edema (HCC) 09/01/2016   Hypothyroidism 09/01/2016   Anxiety and depression 09/01/2016   Lumbar radiculopathy 07/04/2016   Cervical radiculopathy 05/18/2016   Lumbar strain 05/18/2016   Osteoarthritis of lumbar spine 05/18/2016   Chronic constipation 12/17/2015   Chronic kidney disease (CKD), stage III (moderate) (HCC) 12/17/2015   Drug therapy 12/17/2015   Hypercholesterolemia 12/17/2015   Hypertensive kidney disease with CKD stage III (HCC) 12/17/2015   Localized primary osteoarthritis of lower leg 12/17/2015   Osteopenia 12/17/2015   Rotator cuff syndrome of right shoulder 12/17/2015   HNP (herniated nucleus pulposus), cervical 10/19/2011    Class: History of   Carpal tunnel syndrome, left 10/19/2011    Class: Diagnosis of   Past Medical History:  Diagnosis Date   Anxiety    Asthma    Basal cell carcinoma    "right hand; inside my left foot"   Chronic bronchitis (HCC)    Chronic lower back pain    "since worker's comp accident 05/10/2016" (09/01/2016)   Depression    Dyspnea    GERD (gastroesophageal reflux disease)    Kris Mouton  mal seizure (HCC)    as child (09/01/2016)   Heart murmur    High cholesterol    Hypertension    Hypothyroidism    Pneumonia    "once or twice" (09/01/2016)    Family History  Problem Relation Age of Onset   Leukemia Mother    Heart disease Father     Past Surgical History:  Procedure Laterality Date   ANTERIOR CERVICAL DECOMP/DISCECTOMY FUSION  10/23/2011   Procedure: ANTERIOR CERVICAL DECOMPRESSION/DISCECTOMY FUSION 1 LEVEL;  Surgeon: Kerrin Champagne, MD;  Location: [facility] OR;  Service: Orthopedics;  Laterality: N/A;  Anterior cervical discectomy fusion C6-7 with local bone graft, plate, screws, Allograft bone graft    BACK SURGERY     BASAL CELL CARCINOMA EXCISION Bilateral    "right hand; inside my left foot"   CARPAL TUNNEL RELEASE Right    CARPAL TUNNEL RELEASE  10/23/2011   Procedure: CARPAL TUNNEL RELEASE;  Surgeon: Kerrin Champagne, MD;  Location: [facility] OR;  Service: Orthopedics;  Laterality: Left;  left carpal tunnel release   HEMILAMINOTOMY LUMBAR SPINE Bilateral 09/01/2016   partial;  L4-5/notes 09/01/2016   KNEE ARTHROSCOPY Right    "torn cartilage"   LUMBAR LAMINECTOMY Bilateral 09/01/2016   Procedure: BILATERAL PARTIAL HEMILAMINECTOMY L4-5;  Surgeon: Kerrin Champagne, MD;  Location: [facility] OR;  Service: Orthopedics;  Laterality: Bilateral;   TONSILLECTOMY     Social History   Occupational History   Not on file  Tobacco Use   Smoking status: Every Day    Current packs/day: 1.00    Average packs/day: 1 pack/day for 27.0 years (27.0 ttl pk-yrs)    Types: Cigarettes   Smokeless tobacco: Never  Substance and Sexual Activity   Alcohol use: No   Drug use: No   Sexual activity: Never

## 2023-05-13 ENCOUNTER — Ambulatory Visit: Payer: Medicare Other | Admitting: Rehabilitative and Restorative Service Providers"

## 2023-05-18 ENCOUNTER — Ambulatory Visit: Payer: BLUE CROSS/BLUE SHIELD | Admitting: Neurology

## 2023-05-18 ENCOUNTER — Ambulatory Visit: Payer: Medicare Other | Admitting: Rehabilitative and Restorative Service Providers"

## 2023-05-26 ENCOUNTER — Other Ambulatory Visit: Payer: Self-pay | Admitting: Neurosurgery

## 2023-05-26 DIAGNOSIS — M4722 Other spondylosis with radiculopathy, cervical region: Secondary | ICD-10-CM

## 2023-06-01 ENCOUNTER — Ambulatory Visit
Admission: RE | Admit: 2023-06-01 | Discharge: 2023-06-01 | Disposition: A | Discharge status: Home / Self Care | Payer: Medicare Other | Source: Ambulatory Visit | Attending: Neurosurgery | Admitting: Neurosurgery

## 2023-06-01 DIAGNOSIS — M4722 Other spondylosis with radiculopathy, cervical region: Secondary | ICD-10-CM

## 2023-06-01 MED ORDER — MEPERIDINE HCL 50 MG/ML IJ SOLN: 50.0000 mg | Freq: Once | INTRAMUSCULAR | Status: DC | PRN

## 2023-06-01 MED ORDER — DIAZEPAM 5 MG PO TABS
10.0000 mg | ORAL_TABLET | Freq: Once | ORAL | Status: AC
  Administered 2023-06-01: 10 mg via ORAL

## 2023-06-01 MED ORDER — ONDANSETRON HCL 4 MG/2ML IJ SOLN: 4.0000 mg | Freq: Once | INTRAMUSCULAR | Status: DC | PRN

## 2023-06-01 MED ORDER — IOPAMIDOL (ISOVUE-M 300) INJECTION 61%
10.0000 mL | Freq: Once | INTRAMUSCULAR | Status: AC | PRN
  Administered 2023-06-01: 10 mL via INTRATHECAL

## 2023-06-01 NOTE — Discharge Instructions (Signed)

## 2023-06-16 ENCOUNTER — Ambulatory Visit (INDEPENDENT_AMBULATORY_CARE_PROVIDER_SITE_OTHER): Payer: Medicare Other | Admitting: Neurology

## 2023-06-16 ENCOUNTER — Encounter: Payer: Self-pay | Admitting: Neurology

## 2023-06-16 VITALS — BP 144/82 | HR 72 | Ht 71.0 in | Wt 274.0 lb

## 2023-06-16 DIAGNOSIS — M75101 Unspecified rotator cuff tear or rupture of right shoulder, not specified as traumatic: Secondary | ICD-10-CM

## 2023-06-16 DIAGNOSIS — Z8669 Personal history of other diseases of the nervous system and sense organs: Secondary | ICD-10-CM | POA: Diagnosis not present

## 2023-06-16 DIAGNOSIS — M5412 Radiculopathy, cervical region: Secondary | ICD-10-CM

## 2023-06-16 DIAGNOSIS — G5602 Carpal tunnel syndrome, left upper limb: Secondary | ICD-10-CM
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
## 2023-06-29 ENCOUNTER — Ambulatory Visit (INDEPENDENT_AMBULATORY_CARE_PROVIDER_SITE_OTHER): Payer: Medicare Other | Admitting: Neurology

## 2023-06-29 DIAGNOSIS — R569 Unspecified convulsions: Secondary | ICD-10-CM

## 2023-06-29 DIAGNOSIS — G5602 Carpal tunnel syndrome, left upper limb: Secondary | ICD-10-CM

## 2023-06-29 DIAGNOSIS — M5412 Radiculopathy, cervical region: Secondary | ICD-10-CM

## 2023-06-29 DIAGNOSIS — M75101 Unspecified rotator cuff tear or rupture of right shoulder, not specified as traumatic: Secondary | ICD-10-CM

## 2023-06-29 DIAGNOSIS — Z8669 Personal history of other diseases of the nervous system and sense organs: Secondary | ICD-10-CM

## 2023-07-14 DIAGNOSIS — H524 Presbyopia: Secondary | ICD-10-CM | POA: Diagnosis not present

## 2023-07-14 DIAGNOSIS — E119 Type 2 diabetes mellitus without complications: Secondary | ICD-10-CM | POA: Diagnosis not present

## 2023-07-20 NOTE — Procedures (Signed)
    History:  52 year old man with history of seizure. EEG to rule out epileptiform activities   EEG classification: Awake and drowsy  Duration: 25 minutes   Technical aspects: This EEG study was done with scalp electrodes positioned according to the 10-20 International system of electrode placement. Electrical activity was reviewed with band pass filter of 1-70Hz , sensitivity of 7 uV/mm, display speed of 65mm/sec with a 60Hz  notched filter applied as appropriate. EEG data were recorded continuously and digitally stored.   Description of the recording: The background rhythms of this recording consists of a fairly well modulated medium amplitude alpha rhythm of 10 Hz that is reactive to eye opening and closure. Present in the anterior head region is a 15-20 Hz beta activity. Photic stimulation was performed, did not show any abnormalities. Hyperventilation was also performed, did not show any abnormalities. Drowsiness was manifested by background fragmentation. No abnormal epileptiform discharges seen during this recording. There was no focal slowing. There were no electrographic seizure identified.   Abnormality: None   Impression: This is a normal EEG recorded while drowsy and awake. No evidence of interictal epileptiform discharges. Normal EEGs, however, do not rule out epilepsy.    Windell Norfolk, MD [facility]

## 2023-07-22 ENCOUNTER — Encounter: Payer: Self-pay | Admitting: Anesthesiology

## 2023-08-12 ENCOUNTER — Telehealth: Payer: Self-pay | Admitting: Neurology

## 2023-08-12 NOTE — Telephone Encounter (Signed)
[facility] appt confirmation for 08/13/23

## 2023-08-13 ENCOUNTER — Encounter: Payer: Medicare Other | Admitting: Neurology

## 2023-09-03 DIAGNOSIS — M542 Cervicalgia: Secondary | ICD-10-CM | POA: Diagnosis not present

## 2023-10-01 DIAGNOSIS — M545 Low back pain, unspecified: Secondary | ICD-10-CM | POA: Diagnosis not present

## 2023-10-01 DIAGNOSIS — E119 Type 2 diabetes mellitus without complications: Secondary | ICD-10-CM | POA: Diagnosis not present

## 2023-10-01 DIAGNOSIS — K219 Gastro-esophageal reflux disease without esophagitis: Secondary | ICD-10-CM | POA: Diagnosis not present

## 2023-10-01 DIAGNOSIS — G8929 Other chronic pain: Secondary | ICD-10-CM | POA: Diagnosis not present

## 2023-10-01 DIAGNOSIS — E7800 Pure hypercholesterolemia, unspecified: Secondary | ICD-10-CM | POA: Diagnosis not present

## 2023-10-01 DIAGNOSIS — F411 Generalized anxiety disorder: Secondary | ICD-10-CM | POA: Diagnosis not present

## 2023-10-01 DIAGNOSIS — I10 Essential (primary) hypertension: Secondary | ICD-10-CM | POA: Diagnosis not present

## 2023-10-01 DIAGNOSIS — M25561 Pain in right knee: Secondary | ICD-10-CM | POA: Diagnosis not present

## 2023-11-08 ENCOUNTER — Telehealth: Payer: Self-pay | Admitting: Neurology

## 2023-11-08 NOTE — Telephone Encounter (Signed)
MYC confirmation

## 2023-11-10 ENCOUNTER — Encounter: Payer: Self-pay | Admitting: Neurology

## 2023-11-10 ENCOUNTER — Encounter: Payer: Medicare Other | Admitting: Neurology

## 2023-12-14 DIAGNOSIS — J029 Acute pharyngitis, unspecified: Secondary | ICD-10-CM | POA: Diagnosis not present

## 2023-12-14 DIAGNOSIS — R6883 Chills (without fever): Secondary | ICD-10-CM | POA: Diagnosis not present

## 2023-12-14 DIAGNOSIS — R051 Acute cough: Secondary | ICD-10-CM | POA: Diagnosis not present

## 2023-12-14 DIAGNOSIS — R5383 Other fatigue: Secondary | ICD-10-CM | POA: Diagnosis not present

## 2023-12-14 DIAGNOSIS — R197 Diarrhea, unspecified: Secondary | ICD-10-CM | POA: Diagnosis not present

## 2023-12-14 DIAGNOSIS — R0981 Nasal congestion: Secondary | ICD-10-CM | POA: Diagnosis not present

## 2023-12-14 DIAGNOSIS — J988 Other specified respiratory disorders: Secondary | ICD-10-CM | POA: Diagnosis not present

## 2024-02-21 DIAGNOSIS — H52223 Regular astigmatism, bilateral: Secondary | ICD-10-CM | POA: Diagnosis not present

## 2024-03-16 DIAGNOSIS — I10 Essential (primary) hypertension: Secondary | ICD-10-CM | POA: Diagnosis not present

## 2024-03-16 DIAGNOSIS — Z0000 Encounter for general adult medical examination without abnormal findings: Secondary | ICD-10-CM | POA: Diagnosis not present

## 2024-03-16 DIAGNOSIS — F411 Generalized anxiety disorder: Secondary | ICD-10-CM | POA: Diagnosis not present

## 2024-03-16 DIAGNOSIS — K219 Gastro-esophageal reflux disease without esophagitis: Secondary | ICD-10-CM | POA: Diagnosis not present

## 2024-03-16 DIAGNOSIS — M545 Low back pain, unspecified: Secondary | ICD-10-CM | POA: Diagnosis not present

## 2024-03-16 DIAGNOSIS — E119 Type 2 diabetes mellitus without complications: Secondary | ICD-10-CM | POA: Diagnosis not present

## 2024-03-16 DIAGNOSIS — F17210 Nicotine dependence, cigarettes, uncomplicated: Secondary | ICD-10-CM | POA: Diagnosis not present

## 2024-03-16 DIAGNOSIS — R0602 Shortness of breath: Secondary | ICD-10-CM | POA: Diagnosis not present

## 2024-05-08 DIAGNOSIS — I10 Essential (primary) hypertension: Secondary | ICD-10-CM | POA: Diagnosis not present

## 2024-05-08 DIAGNOSIS — M25551 Pain in right hip: Secondary | ICD-10-CM | POA: Diagnosis not present

## 2024-05-08 DIAGNOSIS — E119 Type 2 diabetes mellitus without complications: Secondary | ICD-10-CM | POA: Diagnosis not present

## 2024-05-08 DIAGNOSIS — M1611 Unilateral primary osteoarthritis, right hip: Secondary | ICD-10-CM | POA: Diagnosis not present

## 2024-05-08 DIAGNOSIS — F17210 Nicotine dependence, cigarettes, uncomplicated: Secondary | ICD-10-CM | POA: Diagnosis not present

## 2024-06-02 DIAGNOSIS — M25561 Pain in right knee: Secondary | ICD-10-CM | POA: Diagnosis not present

## 2024-06-02 DIAGNOSIS — M1611 Unilateral primary osteoarthritis, right hip: Secondary | ICD-10-CM | POA: Diagnosis not present
